# Patient Record
Sex: Female | Born: 1965 | Race: Black or African American | Hispanic: No | Smoking: Current some day smoker
Health system: Southern US, Community
[De-identification: ages and names within clinical notes are randomized; demographics above are authoritative.]

## PROBLEM LIST (undated history)

## (undated) DIAGNOSIS — M1990 Unspecified osteoarthritis, unspecified site: Secondary | ICD-10-CM

## (undated) DIAGNOSIS — J45909 Unspecified asthma, uncomplicated: Secondary | ICD-10-CM

## (undated) DIAGNOSIS — I10 Essential (primary) hypertension: Secondary | ICD-10-CM

## (undated) DIAGNOSIS — E119 Type 2 diabetes mellitus without complications: Secondary | ICD-10-CM

## (undated) DIAGNOSIS — M109 Gout, unspecified: Secondary | ICD-10-CM

## (undated) HISTORY — DX: Essential (primary) hypertension: I10

## (undated) HISTORY — DX: Type 2 diabetes mellitus without complications: E11.9

---

## 2007-05-24 ENCOUNTER — Emergency Department (HOSPITAL_COMMUNITY): Admission: EM | Admit: 2007-05-24 | Discharge: 2007-05-24 | Payer: Self-pay | Admitting: Emergency Medicine

## 2009-01-18 ENCOUNTER — Emergency Department (HOSPITAL_COMMUNITY): Admission: EM | Admit: 2009-01-18 | Discharge: 2009-01-18 | Payer: Self-pay | Admitting: Emergency Medicine

## 2009-07-02 ENCOUNTER — Emergency Department (HOSPITAL_COMMUNITY): Admission: EM | Admit: 2009-07-02 | Discharge: 2009-07-03 | Payer: Self-pay | Admitting: Emergency Medicine

## 2010-08-28 ENCOUNTER — Emergency Department (HOSPITAL_COMMUNITY): Payer: Self-pay

## 2010-08-28 ENCOUNTER — Emergency Department (HOSPITAL_COMMUNITY)
Admission: EM | Admit: 2010-08-28 | Discharge: 2010-08-28 | Disposition: A | Discharge status: Home / Self Care | Payer: Worker's Compensation | Attending: Emergency Medicine | Admitting: Emergency Medicine

## 2010-08-28 DIAGNOSIS — Y990 Civilian activity done for income or pay: Secondary | ICD-10-CM | POA: Insufficient documentation

## 2010-08-28 DIAGNOSIS — M25476 Effusion, unspecified foot: Secondary | ICD-10-CM | POA: Insufficient documentation

## 2010-08-28 DIAGNOSIS — S93409A Sprain of unspecified ligament of unspecified ankle, initial encounter: Secondary | ICD-10-CM | POA: Insufficient documentation

## 2010-08-28 DIAGNOSIS — M549 Dorsalgia, unspecified: Secondary | ICD-10-CM | POA: Insufficient documentation

## 2010-08-28 DIAGNOSIS — W208XXA Other cause of strike by thrown, projected or falling object, initial encounter: Secondary | ICD-10-CM | POA: Insufficient documentation

## 2010-08-28 DIAGNOSIS — M25579 Pain in unspecified ankle and joints of unspecified foot: Secondary | ICD-10-CM | POA: Insufficient documentation

## 2010-08-28 DIAGNOSIS — M25473 Effusion, unspecified ankle: Secondary | ICD-10-CM | POA: Insufficient documentation

## 2010-08-28 DIAGNOSIS — S8990XA Unspecified injury of unspecified lower leg, initial encounter: Secondary | ICD-10-CM | POA: Insufficient documentation

## 2010-08-28 DIAGNOSIS — J45909 Unspecified asthma, uncomplicated: Secondary | ICD-10-CM | POA: Insufficient documentation

## 2010-08-28 DIAGNOSIS — G8929 Other chronic pain: Secondary | ICD-10-CM | POA: Insufficient documentation

## 2010-09-21 ENCOUNTER — Ambulatory Visit (INDEPENDENT_AMBULATORY_CARE_PROVIDER_SITE_OTHER): Payer: BC Managed Care – PPO

## 2010-09-21 ENCOUNTER — Inpatient Hospital Stay (INDEPENDENT_AMBULATORY_CARE_PROVIDER_SITE_OTHER)
Admission: RE | Admit: 2010-09-21 | Discharge: 2010-09-21 | Disposition: A | Discharge status: Home / Self Care | Payer: BC Managed Care – PPO | Source: Ambulatory Visit | Attending: Family Medicine | Admitting: Family Medicine

## 2010-09-21 DIAGNOSIS — J069 Acute upper respiratory infection, unspecified: Secondary | ICD-10-CM

## 2010-09-21 DIAGNOSIS — B9789 Other viral agents as the cause of diseases classified elsewhere: Secondary | ICD-10-CM

## 2010-10-06 LAB — DIFFERENTIAL
Basophils Absolute: 0 10*3/uL (ref 0.0–0.1)
Basophils Relative: 0 % (ref 0–1)
Monocytes Absolute: 0.6 10*3/uL (ref 0.1–1.0)
Monocytes Relative: 5 % (ref 3–12)
Neutro Abs: 9.4 10*3/uL — ABNORMAL HIGH (ref 1.7–7.7)
Neutrophils Relative %: 71 % (ref 43–77)

## 2010-10-06 LAB — CBC
HCT: 42.8 % (ref 36.0–46.0)
Hemoglobin: 14 g/dL (ref 12.0–15.0)
MCHC: 32.6 g/dL (ref 30.0–36.0)
MCV: 85.6 fL (ref 78.0–100.0)
Platelets: 308 10*3/uL (ref 150–400)
RDW: 13.3 % (ref 11.5–15.5)
WBC: 13.2 10*3/uL — ABNORMAL HIGH (ref 4.0–10.5)

## 2010-10-06 LAB — URINALYSIS, ROUTINE W REFLEX MICROSCOPIC
Bilirubin Urine: NEGATIVE
Specific Gravity, Urine: 1.03 (ref 1.005–1.030)

## 2010-10-06 LAB — URINE MICROSCOPIC-ADD ON

## 2010-10-06 LAB — POCT I-STAT, CHEM 8
BUN: 16 mg/dL (ref 6–23)
Calcium, Ion: 1.1 mmol/L — ABNORMAL LOW (ref 1.12–1.32)
Chloride: 110 mEq/L (ref 96–112)
Creatinine, Ser: 0.7 mg/dL (ref 0.4–1.2)
Glucose, Bld: 94 mg/dL (ref 70–99)

## 2010-10-06 LAB — COMPREHENSIVE METABOLIC PANEL
ALT: 15 U/L (ref 0–35)
AST: 26 U/L (ref 0–37)
Albumin: 4.2 g/dL (ref 3.5–5.2)
BUN: 12 mg/dL (ref 6–23)
CO2: 22 mEq/L (ref 19–32)
Creatinine, Ser: 0.71 mg/dL (ref 0.4–1.2)
GFR calc non Af Amer: >60 mL/min (ref >60)
Total Bilirubin: 1.1 mg/dL (ref 0.3–1.2)
Total Protein: 7.3 g/dL (ref 6.0–8.3)

## 2010-10-06 LAB — HEMOCCULT GUIAC POC 1CARD (OFFICE): Fecal Occult Bld: POSITIVE

## 2010-10-06 LAB — LIPASE, BLOOD: Lipase: 14 U/L (ref 11–59)

## 2011-03-11 ENCOUNTER — Other Ambulatory Visit: Payer: Self-pay | Admitting: Family Medicine

## 2011-03-11 ENCOUNTER — Ambulatory Visit
Admission: RE | Admit: 2011-03-11 | Discharge: 2011-03-11 | Disposition: A | Discharge status: Home / Self Care | Payer: BC Managed Care – PPO | Source: Ambulatory Visit | Attending: Family Medicine | Admitting: Family Medicine

## 2011-03-11 DIAGNOSIS — M12569 Traumatic arthropathy, unspecified knee: Secondary | ICD-10-CM

## 2011-03-28 ENCOUNTER — Emergency Department (HOSPITAL_COMMUNITY)
Admission: EM | Admit: 2011-03-28 | Discharge: 2011-03-28 | Disposition: A | Discharge status: Home / Self Care | Payer: BC Managed Care – PPO | Attending: Emergency Medicine | Admitting: Emergency Medicine

## 2011-03-28 ENCOUNTER — Emergency Department (HOSPITAL_COMMUNITY): Payer: BC Managed Care – PPO

## 2011-03-28 DIAGNOSIS — M545 Low back pain, unspecified: Secondary | ICD-10-CM | POA: Insufficient documentation

## 2011-03-28 DIAGNOSIS — R350 Frequency of micturition: Secondary | ICD-10-CM | POA: Insufficient documentation

## 2011-03-28 DIAGNOSIS — Z79899 Other long term (current) drug therapy: Secondary | ICD-10-CM | POA: Insufficient documentation

## 2011-03-28 DIAGNOSIS — Z87891 Personal history of nicotine dependence: Secondary | ICD-10-CM | POA: Insufficient documentation

## 2011-03-28 DIAGNOSIS — N200 Calculus of kidney: Secondary | ICD-10-CM | POA: Insufficient documentation

## 2011-03-28 DIAGNOSIS — G8929 Other chronic pain: Secondary | ICD-10-CM | POA: Insufficient documentation

## 2011-03-28 LAB — URINALYSIS, ROUTINE W REFLEX MICROSCOPIC
Bilirubin Urine: NEGATIVE
Ketones, ur: NEGATIVE mg/dL
Urobilinogen, UA: 0.2 mg/dL (ref 0.0–1.0)
pH: 8.5 — ABNORMAL HIGH (ref 5.0–8.0)

## 2011-03-28 LAB — POCT I-STAT, CHEM 8
Creatinine, Ser: 0.7 mg/dL (ref 0.50–1.10)
Hemoglobin: 12.9 g/dL (ref 12.0–15.0)
Sodium: 141 mEq/L (ref 135–145)
TCO2: 23 mmol/L (ref 0–100)

## 2011-03-28 LAB — WET PREP, GENITAL
WBC, Wet Prep HPF POC: NONE SEEN
Yeast Wet Prep HPF POC: NONE SEEN

## 2011-03-28 LAB — URINE MICROSCOPIC-ADD ON

## 2011-03-28 LAB — POCT PREGNANCY, URINE: Preg Test, Ur: NEGATIVE

## 2011-03-28 MED ORDER — IOHEXOL 300 MG/ML  SOLN
130.0000 mL | Freq: Once | INTRAMUSCULAR | Status: AC | PRN
  Administered 2011-03-28: 130 mL via INTRAVENOUS

## 2011-03-30 LAB — GC/CHLAMYDIA PROBE AMP, GENITAL
Chlamydia, DNA Probe: NEGATIVE
GC Probe Amp, Genital: NEGATIVE

## 2012-03-31 ENCOUNTER — Encounter (HOSPITAL_COMMUNITY): Payer: Self-pay | Admitting: Emergency Medicine

## 2012-03-31 ENCOUNTER — Emergency Department (HOSPITAL_COMMUNITY)
Admission: EM | Admit: 2012-03-31 | Discharge: 2012-03-31 | Disposition: A | Discharge status: Home / Self Care | Payer: BC Managed Care – PPO | Attending: Emergency Medicine | Admitting: Emergency Medicine

## 2012-03-31 ENCOUNTER — Emergency Department (HOSPITAL_COMMUNITY): Payer: BC Managed Care – PPO

## 2012-03-31 DIAGNOSIS — Z886 Allergy status to analgesic agent status: Secondary | ICD-10-CM | POA: Insufficient documentation

## 2012-03-31 DIAGNOSIS — M79672 Pain in left foot: Secondary | ICD-10-CM

## 2012-03-31 DIAGNOSIS — I878 Other specified disorders of veins: Secondary | ICD-10-CM

## 2012-03-31 DIAGNOSIS — Z91018 Allergy to other foods: Secondary | ICD-10-CM | POA: Insufficient documentation

## 2012-03-31 DIAGNOSIS — M25572 Pain in left ankle and joints of left foot: Secondary | ICD-10-CM

## 2012-03-31 DIAGNOSIS — I872 Venous insufficiency (chronic) (peripheral): Secondary | ICD-10-CM | POA: Insufficient documentation

## 2012-03-31 DIAGNOSIS — Z87828 Personal history of other (healed) physical injury and trauma: Secondary | ICD-10-CM | POA: Insufficient documentation

## 2012-03-31 DIAGNOSIS — M25579 Pain in unspecified ankle and joints of unspecified foot: Secondary | ICD-10-CM | POA: Insufficient documentation

## 2012-03-31 HISTORY — DX: Unspecified asthma, uncomplicated: J45.909

## 2012-03-31 MED ORDER — OXYCODONE-ACETAMINOPHEN 5-325 MG PO TABS
2.0000 | ORAL_TABLET | Freq: Once | ORAL | Status: DC
  Filled 2012-03-31: qty 2

## 2012-03-31 MED ORDER — OXYCODONE-ACETAMINOPHEN 5-325 MG PO TABS: 1.0000 | ORAL_TABLET | Freq: Four times a day (QID) | ORAL | Status: DC | PRN

## 2012-03-31 MED ORDER — TRAMADOL HCL 50 MG PO TABS
50.0000 mg | ORAL_TABLET | Freq: Once | ORAL | Status: AC
  Administered 2012-03-31: 50 mg via ORAL
  Filled 2012-03-31: qty 1

## 2012-03-31 NOTE — ED Notes (Addendum)
Pt reports pain on her left ankle/foot, started last night and worsen this morning. Pt reports hurting her ankle at work and does not remember when.

## 2012-03-31 NOTE — ED Provider Notes (Signed)
Medical screening examination/treatment/procedure(s) were performed by non-physician practitioner and as supervising physician I was immediately available for consultation/collaboration.   Lyanne Co, MD 03/31/12 [phone]

## 2012-03-31 NOTE — ED Provider Notes (Signed)
History     CSN: [id number]  Arrival date & time 03/31/12  1253   First MD Initiated Contact with Patient 03/31/12 1324      Chief Complaint  Patient presents with  . Ankle Pain  . Foot Pain    (Consider location/radiation/quality/duration/timing/severity/associated sxs/prior treatment) HPI Comments: Robin Meza 46 y.o. female   The chief complaint is: Patient presents with:   Ankle Pain   Foot Pain   The patient has medical history significant for:   Past Medical History:   Asthma                                                      Patient presents s/p fall 08/2010 at work. Patient was seen in ED during that time and imaging was unremarkable. Patient states the the ankle swells with pain and then "opens up" Patient states that last week there was open area on the medial portion of her left ankle, she went to her primary care who wrote for bactroban ointment. Denies fever or chills. Denies NVD or abdominal pain. Reports gait issues and need for cane.      The history is provided by the patient. No language interpreter was used.    Past Medical History  Diagnosis Date  . Asthma     Past Surgical History  Procedure Date  . Abdominal hysterectomy     No family history on file.  History  Substance Use Topics  . Smoking status: Not on file  . Smokeless tobacco: Current User  . Alcohol Use: No    OB History    Grav Para Term Preterm Abortions TAB SAB Ect Mult Living                  Review of Systems  Constitutional: Negative for fever and chills.  Cardiovascular: Positive for leg swelling.  Gastrointestinal: Negative for nausea, vomiting, abdominal pain and diarrhea.  Musculoskeletal: Positive for arthralgias and gait problem.  All other systems reviewed and are negative.    Allergies  Aspirin and Tomato  Home Medications   Current Outpatient Rx  Name Route Sig Dispense Refill  . CYCLOBENZAPRINE HCL 5 MG PO TABS Oral Take 5 mg by mouth 2  (two) times daily.    Marland Kitchen HYDROCHLOROTHIAZIDE 25 MG PO TABS Oral Take 25 mg by mouth daily.    . ADULT MULTIVITAMIN W/MINERALS CH Oral Take 1 tablet by mouth daily.    Marland Kitchen MUPIROCIN 2 % EX OINT Topical Apply 1 application topically 3 (three) times daily.      BP 127/77  Pulse 92  Temp 98.9 F (37.2 C) (Oral)  Resp 16  SpO2 98%  LMP 09/21/2010  Physical Exam  Nursing note and vitals reviewed. Constitutional: She appears well-developed and well-nourished.  HENT:  Head: Normocephalic.  Eyes: Conjunctivae normal and EOM are normal. No scleral icterus.  Neck: Normal range of motion. Neck supple.  Cardiovascular: Normal rate, regular rhythm and normal heart sounds.   Pulmonary/Chest: Effort normal and breath sounds normal. She has no rales.  Abdominal: Soft. Bowel sounds are normal.  Musculoskeletal: Normal range of motion. She exhibits tenderness. She exhibits no edema.       No pitting edema appreciated. Good pedal pulse, good cap refill. Tenderness to palpation of medial malleolus. Well healed scar on medial portion of  left lower leg where Bactroban was used to treat ulceration.   Neurological: She is alert.  Skin: Skin is warm and dry.    ED Course  Procedures (including critical care time)  Labs Reviewed - No data to display Dg Ankle Complete Left  03/31/2012  *RADIOLOGY REPORT*  Clinical Data: Diffuse left ankle pain.  LEFT ANKLE COMPLETE - 3+ VIEW  Comparison: 08/28/2010  Findings: No acute bony abnormality.  Specifically, no fracture, subluxation, or dislocation.  Soft tissues are intact. Joint spaces are maintained.  Normal bone mineralization.  IMPRESSION: No acute bony abnormality.   Original Report Authenticated By: Cyndie Chime, M.D.      1. Left ankle pain   2. Left foot pain   3. Venous stasis       MDM  Patient presented with complain of left lower leg and foot pain. Patient attributes this to a work related fall that occurred last year. Imaging at that time was  unremarkable for fracture. Imaging done this visit also unremarkable. Pain medication given with improvement. Patient informed that the edema and ulcerations are most likely secondary to venous stasis. Patient counseled on salt reduction, use of compression hose, and follow-up with primary care. No red flags for DVT, cellulitis, or CHF.        Pixie Casino, PA-C 03/31/12 1535

## 2012-05-25 ENCOUNTER — Emergency Department (HOSPITAL_COMMUNITY)
Admission: EM | Admit: 2012-05-25 | Discharge: 2012-05-25 | Disposition: A | Discharge status: Home / Self Care | Payer: BC Managed Care – PPO | Attending: Emergency Medicine | Admitting: Emergency Medicine

## 2012-05-25 ENCOUNTER — Encounter (HOSPITAL_COMMUNITY): Payer: Self-pay | Admitting: *Deleted

## 2012-05-25 DIAGNOSIS — Y929 Unspecified place or not applicable: Secondary | ICD-10-CM | POA: Insufficient documentation

## 2012-05-25 DIAGNOSIS — W540XXA Bitten by dog, initial encounter: Secondary | ICD-10-CM | POA: Insufficient documentation

## 2012-05-25 DIAGNOSIS — S5010XA Contusion of unspecified forearm, initial encounter: Secondary | ICD-10-CM | POA: Insufficient documentation

## 2012-05-25 DIAGNOSIS — Y939 Activity, unspecified: Secondary | ICD-10-CM | POA: Insufficient documentation

## 2012-05-25 DIAGNOSIS — J45909 Unspecified asthma, uncomplicated: Secondary | ICD-10-CM | POA: Insufficient documentation

## 2012-05-25 DIAGNOSIS — Z79899 Other long term (current) drug therapy: Secondary | ICD-10-CM | POA: Insufficient documentation

## 2012-05-25 NOTE — ED Provider Notes (Signed)
Medical screening examination/treatment/procedure(s) were performed by non-physician practitioner and as supervising physician I was immediately available for consultation/collaboration.  Ethelda Chick, MD 05/25/12 [phone]

## 2012-05-25 NOTE — ED Provider Notes (Signed)
History     CSN: [id number]  Arrival date & time 05/25/12  2116   First MD Initiated Contact with Patient 05/25/12 2211      Chief Complaint  Patient presents with  . Animal Bite    (Consider location/radiation/quality/duration/timing/severity/associated sxs/prior treatment) HPI Comments: 46 year old female presents emergency department after being bit by a dog last night on her right forearm. She states she did not get bit hard and it was over her heavy clothing, and did not notice any open wounds or injury last night. Today she looks down her arm and noticed she had a small bruise. 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 She does not know the immunization status of the dog. Currently states her arm pain is only present if you touch on the area where the bruises. She has not tried any alleviating factors.  The history is provided by the patient.    Past Medical History  Diagnosis Date  . Asthma     Past Surgical History  Procedure Date  . Cesarean section     History reviewed. No pertinent family history.  History  Substance Use Topics  . Smoking status: Not on file  . Smokeless tobacco: Current User  . Alcohol Use: No    OB History    Grav Para Term Preterm Abortions TAB SAB Ect Mult Living                  Review of Systems  Constitutional: Negative for fever and chills.  Musculoskeletal: Negative for joint swelling.  Skin: Positive for color change. Negative for wound.  Neurological: Negative for numbness.    Allergies  Aspirin and Tomato  Home Medications   Current Outpatient Rx  Name  Route  Sig  Dispense  Refill  . CYCLOBENZAPRINE HCL 5 MG PO TABS   Oral   Take 5 mg by mouth 2 (two) times daily.         Marland Kitchen HYDROCHLOROTHIAZIDE 25 MG PO TABS   Oral   Take 25 mg by mouth daily.         . ADULT MULTIVITAMIN W/MINERALS CH   Oral   Take 1 tablet by mouth daily.         . OXYCODONE-ACETAMINOPHEN 5-325 MG PO  TABS   Oral   Take 1 tablet by mouth every 6 (six) hours as needed. Jaw pain           BP 155/86  Pulse 82  Temp 98.1 F (36.7 C) (Oral)  Resp 18  SpO2 96%  Physical Exam  Nursing note and vitals reviewed. Constitutional: She is oriented to person, place, and time. She appears well-developed. No distress.       Overweight  HENT:  Head: Normocephalic and atraumatic.  Eyes: Conjunctivae normal are normal.  Neck: Normal range of motion.  Cardiovascular: Normal rate, regular rhythm, normal heart sounds and intact distal pulses.   Pulmonary/Chest: Effort normal and breath sounds normal.  Musculoskeletal: Normal range of motion. She exhibits no edema.  Neurological: She is alert and oriented to person, place, and time.  Skin: Skin is warm, dry and intact.     Psychiatric: She has a normal mood and affect. Her behavior is normal.    ED Course  Procedures (including critical care time)  Labs Reviewed - No data to display No results found.   1. Dog bite       MDM  70-year-old with small bruise  to her right forearm after being bit by a dog. There is no open wound. Patient was wearing heavy clothing at the time of the bite. Patient only came in today because she noticed a bruise. No need for antibiotics or immunizations.        Trevor Mace, PA-C 05/25/12 2303

## 2012-05-25 NOTE — ED Notes (Signed)
Pt was bit by dog last night to right forearm. States "the dog is around I feed it, its never attacked me before." Pt is unsure of dogs owner or of dog vaccination history. Also reports unknown tetanus shot.

## 2012-09-17 IMAGING — CR DG ANKLE COMPLETE 3+V*L*
3 series · 3 of 3 positions shown · non-contrast
Comparison: None.

CLINICAL DATA: 45-year-old female status post blunt trauma with
pain and swelling.

LEFT ANKLE COMPLETE - 3+ VIEW

[t ankle joint ap left]
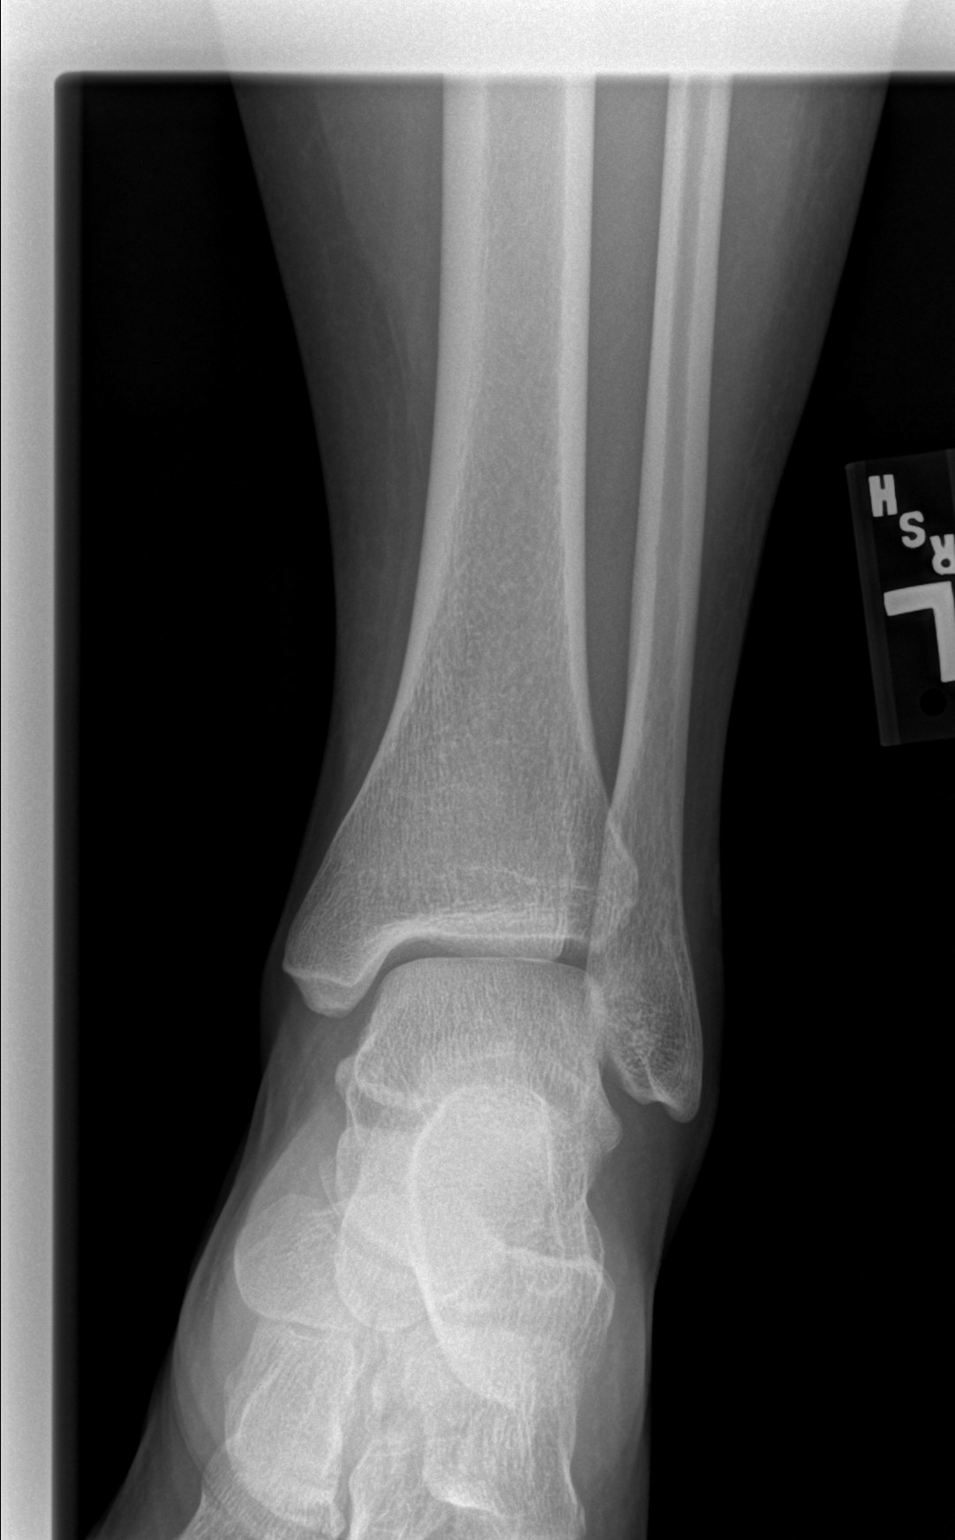

[t ankle joint oblique left]
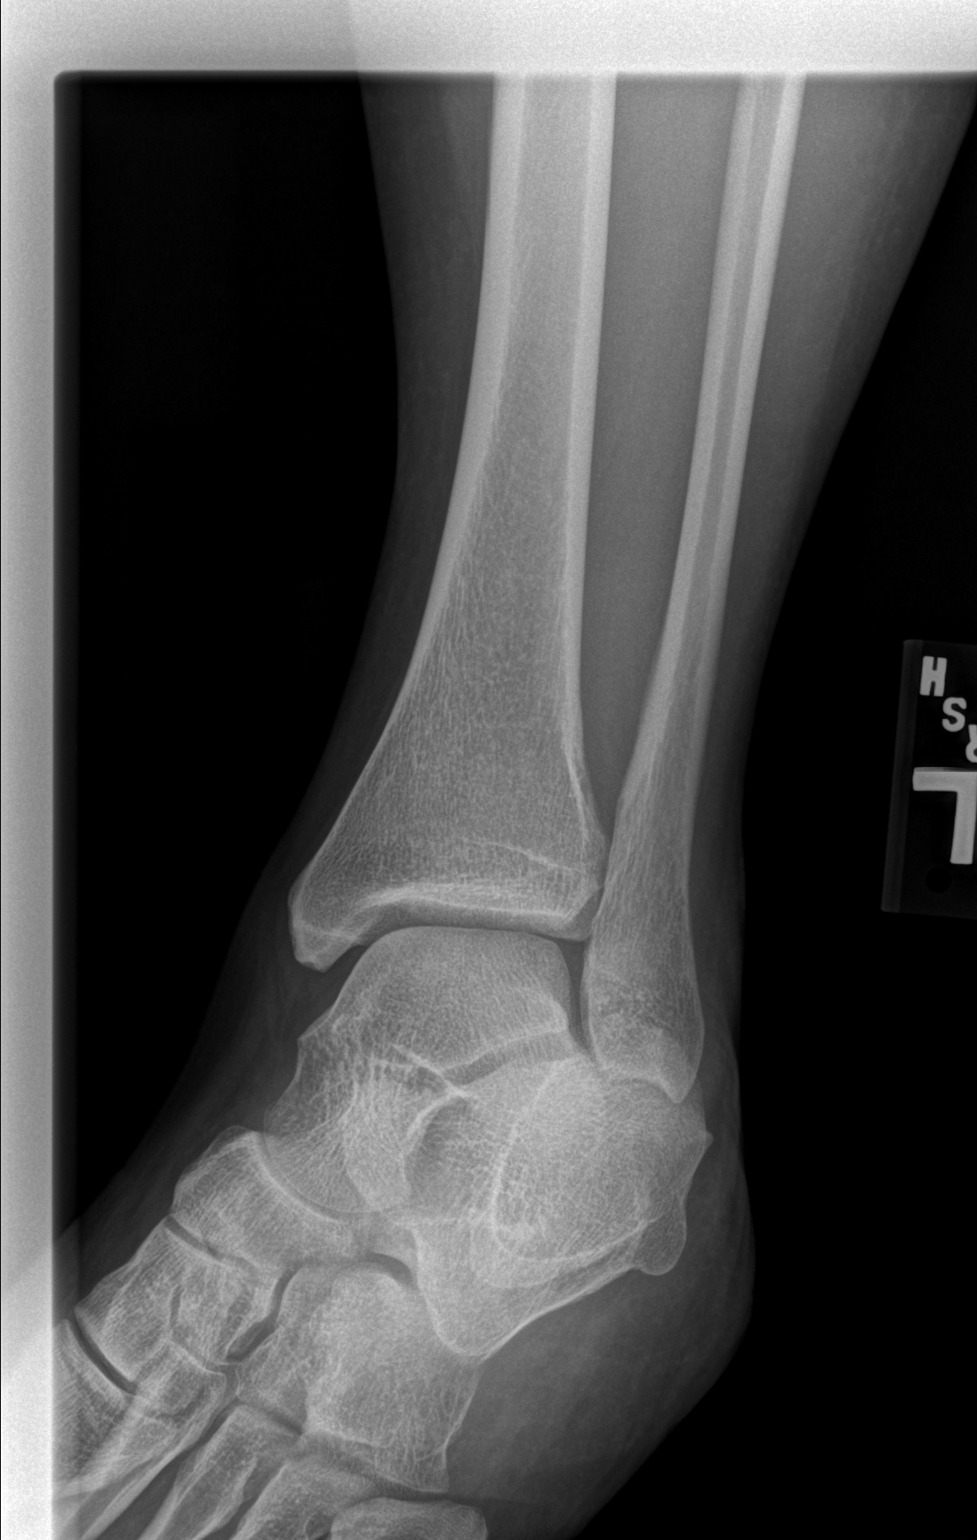

[t ankle joint lat left]
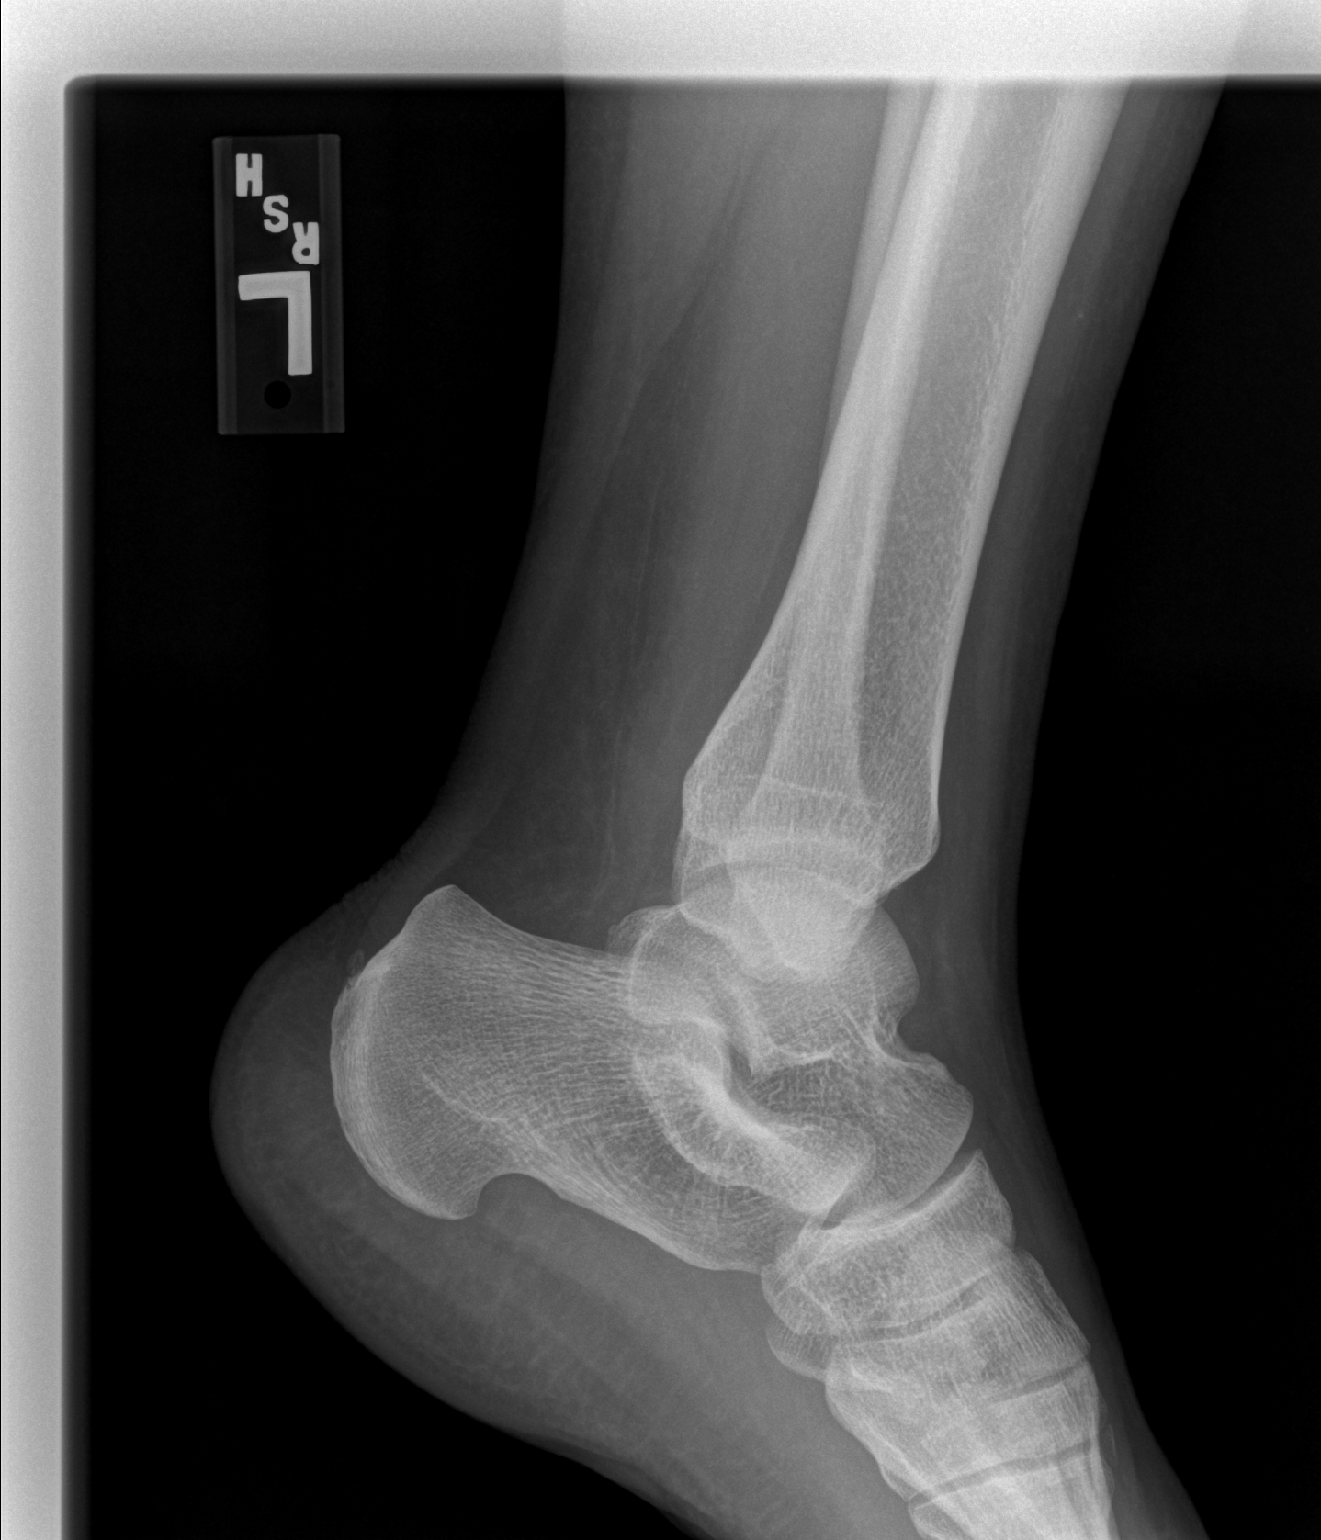

[3 of 3 positions shown; findings below may reference images not displayed]

FINDINGS: Bone mineralization is within normal limits.  Normal
mortise joint alignment.  No joint effusion.  Calcaneus appears
intact.  Talar dome intact.  No acute fracture.
IMPRESSION: No acute fracture or dislocation identified about the left ankle.

## 2012-10-28 ENCOUNTER — Other Ambulatory Visit: Payer: Self-pay | Admitting: Family Medicine

## 2012-10-28 ENCOUNTER — Ambulatory Visit
Admission: RE | Admit: 2012-10-28 | Discharge: 2012-10-28 | Disposition: A | Discharge status: Home / Self Care | Payer: BC Managed Care – PPO | Source: Ambulatory Visit | Attending: Family Medicine | Admitting: Family Medicine

## 2012-10-28 DIAGNOSIS — G8929 Other chronic pain: Secondary | ICD-10-CM

## 2014-04-21 IMAGING — CR DG ANKLE COMPLETE 3+V*L*
3 series · 3 of 3 positions shown · non-contrast
Comparison: 08/28/2010

CLINICAL DATA: Diffuse left ankle pain.

LEFT ANKLE COMPLETE - 3+ VIEW

[x ankle ap left]
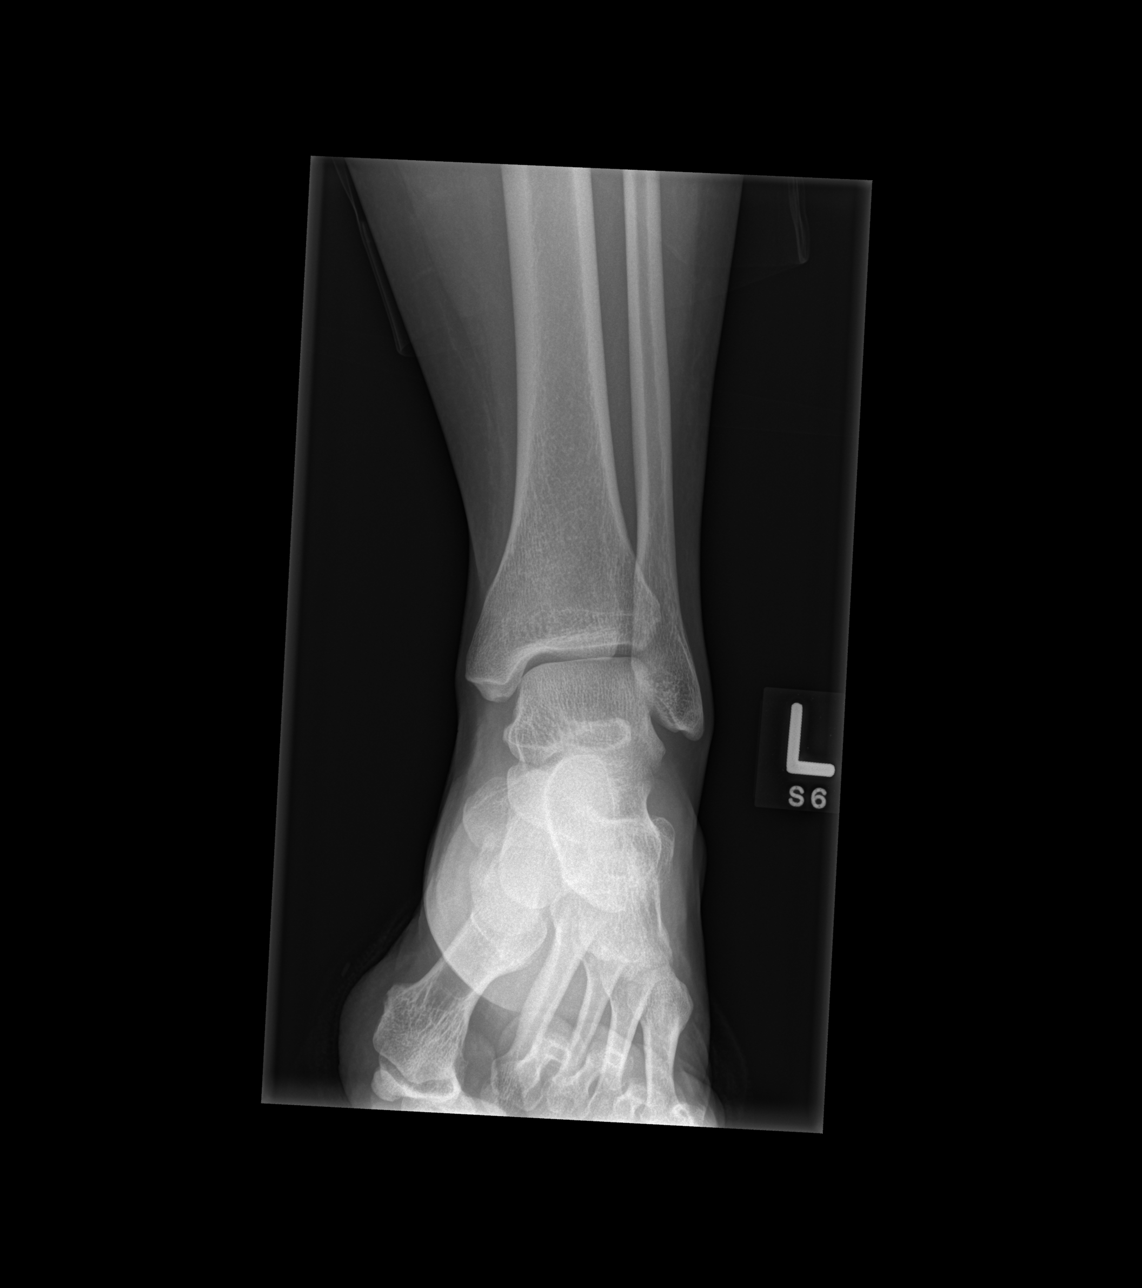

[x ankle obl left]
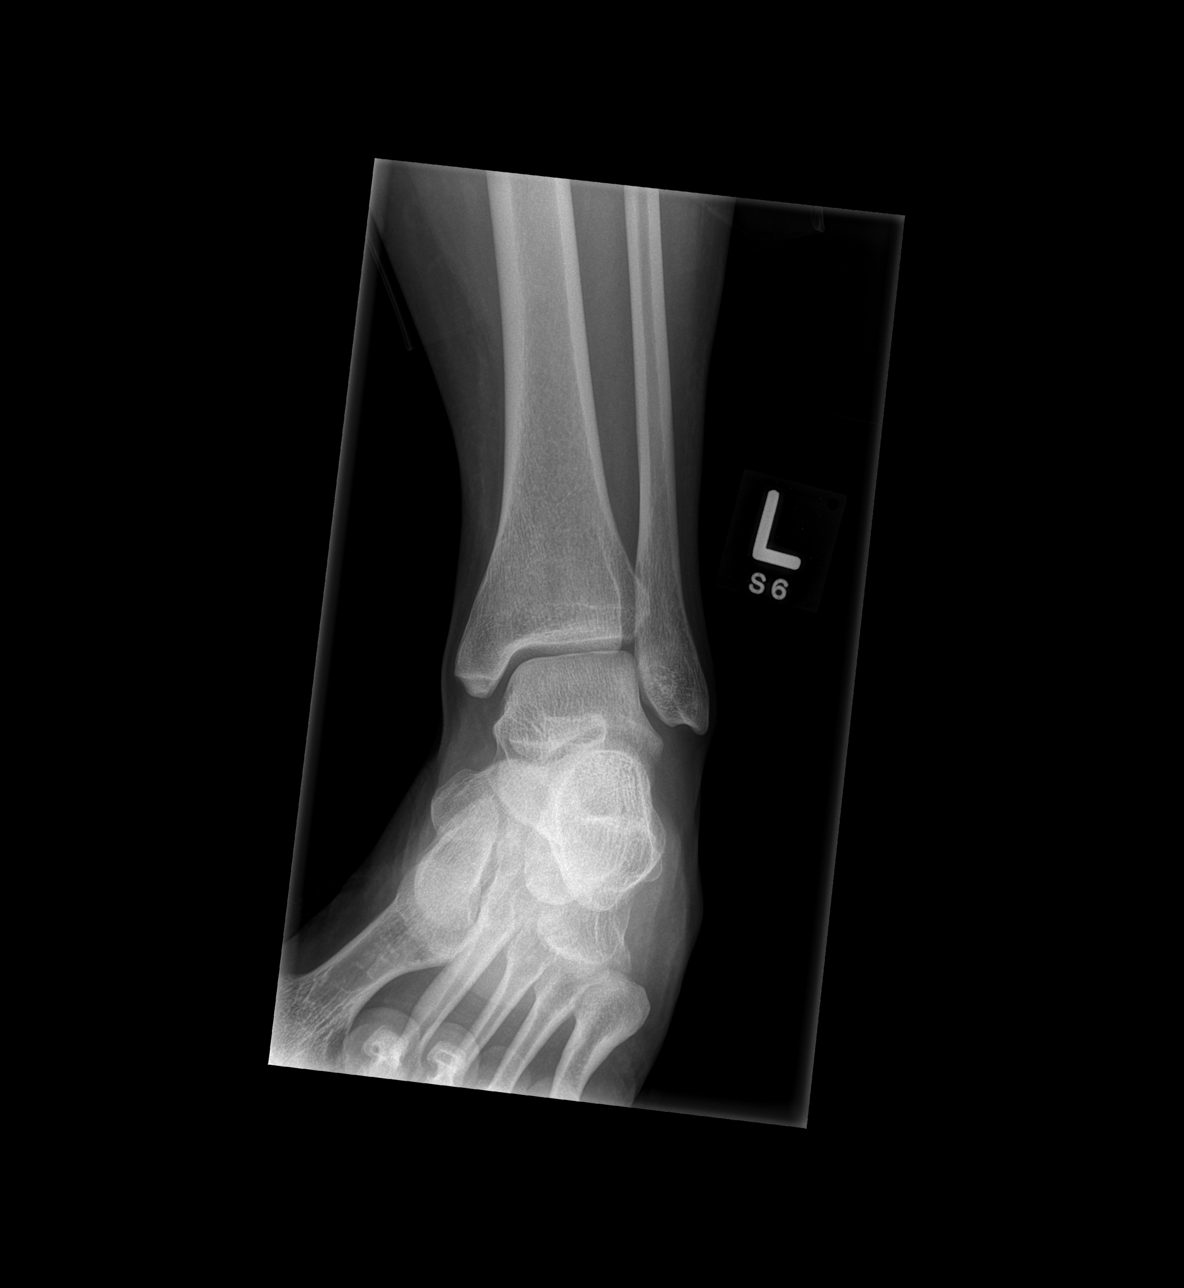

[x ankle lat left]
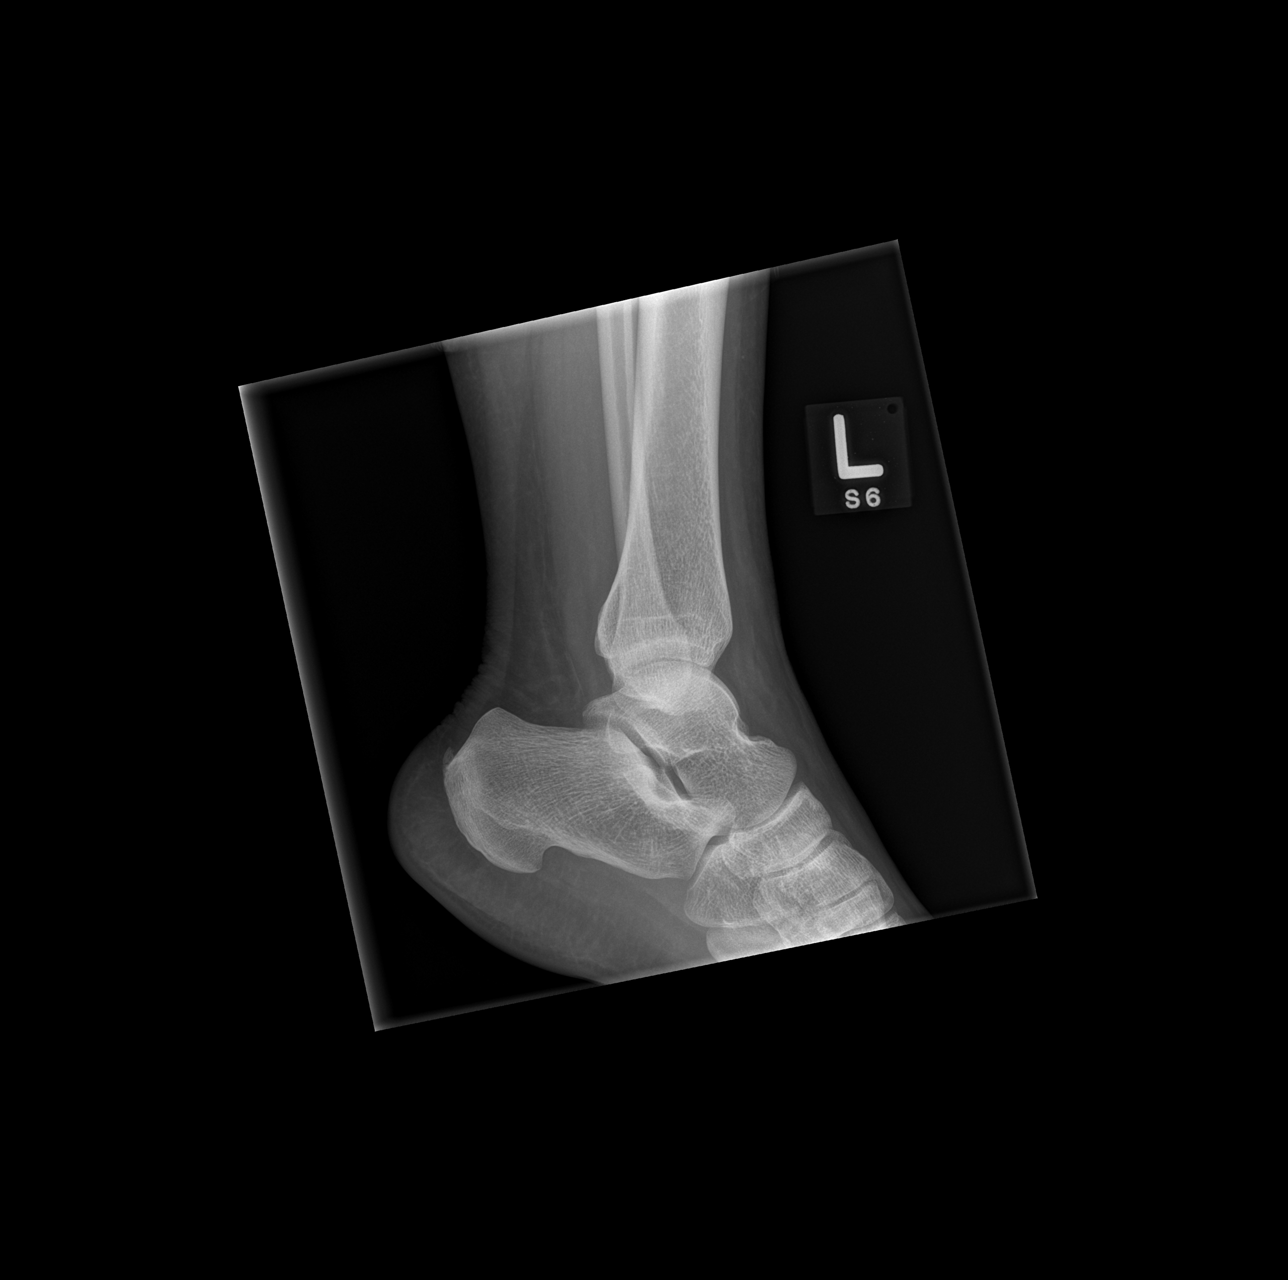

[3 of 3 positions shown; findings below may reference images not displayed]

FINDINGS: No acute bony abnormality.  Specifically, no fracture,
subluxation, or dislocation.  Soft tissues are intact. Joint spaces
are maintained.  Normal bone mineralization.
IMPRESSION: No acute bony abnormality.

## 2017-07-19 ENCOUNTER — Other Ambulatory Visit: Payer: Self-pay

## 2017-07-19 ENCOUNTER — Encounter (HOSPITAL_COMMUNITY): Payer: Self-pay

## 2017-07-19 ENCOUNTER — Emergency Department (HOSPITAL_COMMUNITY)
Admission: EM | Admit: 2017-07-19 | Discharge: 2017-07-19 | Disposition: A | Discharge status: Home / Self Care | Payer: Medicaid Other | Attending: Emergency Medicine | Admitting: Emergency Medicine

## 2017-07-19 DIAGNOSIS — Z0000 Encounter for general adult medical examination without abnormal findings: Secondary | ICD-10-CM

## 2017-07-19 DIAGNOSIS — Z79899 Other long term (current) drug therapy: Secondary | ICD-10-CM | POA: Diagnosis not present

## 2017-07-19 DIAGNOSIS — F17200 Nicotine dependence, unspecified, uncomplicated: Secondary | ICD-10-CM | POA: Insufficient documentation

## 2017-07-19 DIAGNOSIS — J45909 Unspecified asthma, uncomplicated: Secondary | ICD-10-CM | POA: Diagnosis not present

## 2017-07-19 DIAGNOSIS — Z046 Encounter for general psychiatric examination, requested by authority: Secondary | ICD-10-CM | POA: Diagnosis present

## 2017-07-19 HISTORY — DX: Gout, unspecified: M10.9

## 2017-07-19 HISTORY — DX: Unspecified osteoarthritis, unspecified site: M19.90

## 2017-07-19 NOTE — ED Notes (Signed)
Pt attempting to locate transportation home. Pts daughter who attempted to IVC her is waiting in [place]. Daughter has been informed by ED Staff and [facility] [employer] to that we cannot reveal any information due to HIPPAA. Daughter has been asked to leave the premises ED registration and Security.

## 2017-07-19 NOTE — ED Provider Notes (Signed)
[facility]-EMERGENCY DEPT Provider Note   CSN: [id number] Arrival date & time: 07/19/17  2053     History   Chief Complaint Chief Complaint  Patient presents with  . Psychiatric Evaluation    HPI Robin Meza is a 52 y.o. female.  HPI Patient brought in by police.  Reportedly had an argument with her daughter.  Daughter was attempting to take out IVC paperwork but magistrate would not accept it.  Denies suicidal homicidal thoughts.  Patient states she is not sure why her daughter would fill out the paperwork.  Patient is basically without complaints. Past Medical History:  Diagnosis Date  . Arthritis   . Asthma   . Gout     There are no active problems to display for this patient.   Past Surgical History:  Procedure Laterality Date  . CESAREAN SECTION      OB History    No data available       Home Medications    Prior to Admission medications   Medication Sig Start Date End Date Taking? Authorizing Provider  Biotin 1000 MCG tablet Take 1,000 mcg by mouth daily.   Yes [provider]  Docusate Calcium (STOOL SOFTENER PO) Take 1 tablet by mouth daily.   Yes [provider]  hydrochlorothiazide (HYDRODIURIL) 25 MG tablet Take 25 mg by mouth daily.   Yes [provider]  hydrOXYzine (ATARAX/VISTARIL) 10 MG tablet Take 10 mg by mouth 3 (three) times daily as needed for anxiety.   Yes [provider]    Family History History reviewed. No pertinent family history.  Social History Social History   Tobacco Use  . Smoking status: Current Some Day Smoker    Packs/day: 1.00  . Smokeless tobacco: Current User  Substance Use Topics  . Alcohol use: No  . Drug use: Not on file     Allergies   Aspirin and Tomato   Review of Systems Review of Systems  Constitutional: Negative for appetite change.  Respiratory: Negative for shortness of breath.   Gastrointestinal: Negative for abdominal pain.    Endocrine: Negative for polyphagia.  Genitourinary: Negative for flank pain.  Musculoskeletal: Negative for back pain.  Neurological: Negative for speech difficulty.  Hematological: Negative for adenopathy.  Psychiatric/Behavioral: Negative for dysphoric mood and hallucinations.     Physical Exam Updated Vital Signs BP (!) 153/89 (BP Location: Left Arm)   Pulse 89   Temp 98.9 F (37.2 C) (Oral)   Resp 20   LMP 09/21/2010   SpO2 98%   Physical Exam  Constitutional: She appears well-developed.  HENT:  Head: Atraumatic.  Neck: Neck supple.  Cardiovascular: Normal rate.  Pulmonary/Chest: Effort normal.  Abdominal: Soft.  Neurological: She is alert.  Skin: Skin is warm. Capillary refill takes less than 2 seconds.  Psychiatric: She has a normal mood and affect.     ED Treatments / Results  Labs (all labs ordered are listed, but only abnormal results are displayed) Labs Reviewed - No data to display  EKG  EKG Interpretation None       Radiology No results found.  Procedures Procedures (including critical care time)  Medications Ordered in ED Medications - No data to display   Initial Impression / Assessment and Plan / ED Course  I have reviewed the triage vital signs and the nursing notes.  Pertinent labs & imaging results that were available during my care of the patient were reviewed by me and considered in my medical  decision making (see chart for details).     Patient brought in.  Reportedly was having argument with daughter and daughter threatened to take out IVC paperwork.  Patient is without complaints.  Denies suicidal or homicidal thoughts.  The IVC paperwork did not go through with the magistrate and patient was released.  Final Clinical Impressions(s) / ED Diagnoses   Final diagnoses:  Well adult exam    ED Discharge Orders    None       Benjiman CorePickering, Adriann Thau, MD 07/19/17 2250

## 2017-07-19 NOTE — ED Notes (Signed)
Bed: WA28 Expected date:  Expected time:  Means of arrival:  Comments: 

## 2017-07-19 NOTE — ED Triage Notes (Signed)
BIB [employer] from [facility] Home, [employer] reports that pt was involved w/ an argument w/ her daughter who is in the process of IVC'ing pt. Pt denies SI/HI/AH/VH. Pt denies hx of IVC. Pt reports her daughter has hx of mental health illness and has been IVC'd by her. Pt is AOX4, calm, cooperative, NAD. No sign of physical harm or injury observed. Pt ambulatory independently.

## 2017-09-23 DIAGNOSIS — R7303 Prediabetes: Secondary | ICD-10-CM | POA: Insufficient documentation

## 2017-09-23 DIAGNOSIS — E559 Vitamin D deficiency, unspecified: Secondary | ICD-10-CM | POA: Insufficient documentation

## 2018-06-08 DIAGNOSIS — Z6835 Body mass index (BMI) 35.0-35.9, adult: Secondary | ICD-10-CM | POA: Insufficient documentation

## 2018-06-08 DIAGNOSIS — E661 Drug-induced obesity: Secondary | ICD-10-CM | POA: Insufficient documentation

## 2018-11-22 DIAGNOSIS — M4317 Spondylolisthesis, lumbosacral region: Secondary | ICD-10-CM | POA: Insufficient documentation

## 2018-11-22 DIAGNOSIS — J449 Chronic obstructive pulmonary disease, unspecified: Secondary | ICD-10-CM | POA: Insufficient documentation

## 2019-05-19 DIAGNOSIS — M1712 Unilateral primary osteoarthritis, left knee: Secondary | ICD-10-CM | POA: Insufficient documentation

## 2020-02-09 DIAGNOSIS — I5032 Chronic diastolic (congestive) heart failure: Secondary | ICD-10-CM | POA: Insufficient documentation

## 2021-01-08 DIAGNOSIS — Z720 Tobacco use: Secondary | ICD-10-CM | POA: Insufficient documentation

## 2021-07-15 DIAGNOSIS — I2510 Atherosclerotic heart disease of native coronary artery without angina pectoris: Secondary | ICD-10-CM | POA: Insufficient documentation

## 2022-03-26 DIAGNOSIS — N1831 Chronic kidney disease, stage 3a: Secondary | ICD-10-CM | POA: Insufficient documentation

## 2023-04-15 DIAGNOSIS — M47816 Spondylosis without myelopathy or radiculopathy, lumbar region: Secondary | ICD-10-CM | POA: Insufficient documentation

## 2023-07-14 ENCOUNTER — Ambulatory Visit (INDEPENDENT_AMBULATORY_CARE_PROVIDER_SITE_OTHER): Payer: Medicaid Other | Admitting: Family Medicine

## 2023-07-14 ENCOUNTER — Encounter: Payer: Self-pay | Admitting: Family Medicine

## 2023-07-14 VITALS — BP 97/64 | HR 90 | Ht 63.0 in | Wt 200.2 lb

## 2023-07-14 DIAGNOSIS — I5030 Unspecified diastolic (congestive) heart failure: Secondary | ICD-10-CM

## 2023-07-14 DIAGNOSIS — I10 Essential (primary) hypertension: Secondary | ICD-10-CM | POA: Diagnosis not present

## 2023-07-14 DIAGNOSIS — N1831 Chronic kidney disease, stage 3a: Secondary | ICD-10-CM | POA: Diagnosis not present

## 2023-07-14 DIAGNOSIS — I5032 Chronic diastolic (congestive) heart failure: Secondary | ICD-10-CM

## 2023-07-14 DIAGNOSIS — J449 Chronic obstructive pulmonary disease, unspecified: Secondary | ICD-10-CM

## 2023-07-14 DIAGNOSIS — I2510 Atherosclerotic heart disease of native coronary artery without angina pectoris: Secondary | ICD-10-CM

## 2023-07-14 DIAGNOSIS — M47816 Spondylosis without myelopathy or radiculopathy, lumbar region: Secondary | ICD-10-CM

## 2023-07-14 DIAGNOSIS — Z720 Tobacco use: Secondary | ICD-10-CM

## 2023-07-14 DIAGNOSIS — R7303 Prediabetes: Secondary | ICD-10-CM

## 2023-07-14 DIAGNOSIS — Z1231 Encounter for screening mammogram for malignant neoplasm of breast: Secondary | ICD-10-CM

## 2023-07-14 NOTE — Assessment & Plan Note (Signed)
 Discussed what CKD 3a is and what this means. She is on lisinopril that will help with renal protection.  - recently had blood work two weeks ago, will need repeat in two months to evaluate with BMP and microalbumin/cr ratio

## 2023-07-14 NOTE — Progress Notes (Signed)
 New patient visit   Patient: Robin Meza   DOB: 12-30-1965   58 y.o. Female  MRN: [id number] Visit Date: 07/14/2023  Today's healthcare provider: Bernice GORMAN Juneau, DO   Chief Complaint  Patient presents with   New Patient (Initial Visit)    Establish Care    SUBJECTIVE    Chief Complaint  Patient presents with   New Patient (Initial Visit)    Establish Care   HPI HPI     New Patient (Initial Visit)    Additional comments: Establish Care      Last edited by Duwaine Riggs, CMA on 07/14/2023 11:29 AM.      Pt presents to establish care. There is some confusion since she was seeing [facility] and recently saw them last week however pt says she just moved to [place] and does not want to go [place] for care. She has multiple medical conditions.  Pmh  HTN - on lisinopril    Prediabetes - on mounjaro   CAD - followed by [facility]   CHF  - preserved EF  - on furosemide   COPD  - managed by [facility]   Lumbar spondylosis  - followed by [facility]   Current smoker   Obesity   Stage 3a CKD   Needs mammogram Had colonoscopy last year and due in 9 years  Review of Systems  Constitutional:  Negative for diaphoresis, fever and unexpected weight change.  HENT:  Negative for hearing loss, postnasal drip, sneezing and tinnitus.   Eyes:  Negative for visual disturbance.  Respiratory:  Negative for cough and wheezing.   Cardiovascular:  Negative for chest pain and palpitations.  Genitourinary:  Negative for vaginal bleeding and vaginal discharge.  Musculoskeletal:  Negative for arthralgias.  Neurological:  Negative for headaches.  Hematological:  Negative for adenopathy. Does not bruise/bleed easily.       Current Meds  Medication Sig   albuterol (VENTOLIN HFA) 108 (90 Base) MCG/ACT inhaler Inhale into the lungs.   aspirin EC 81 MG tablet Take 1 tablet by mouth daily.   atorvastatin (LIPITOR) 80 MG tablet Take  80 mg by mouth daily.   Biotin 1000 MCG tablet Take 1,000 mcg by mouth daily.   cholecalciferol (VITAMIN D3) 25 MCG (1000 UNIT) tablet Take 1 tablet by mouth daily.   Docusate Calcium (STOOL SOFTENER PO) Take 1 tablet by mouth daily.   empagliflozin  (JARDIANCE ) 10 MG TABS tablet Take 10 mg by mouth daily.   furosemide (LASIX) 80 MG tablet Take 80 mg by mouth daily.   hydrochlorothiazide  (HYDRODIURIL ) 25 MG tablet Take 25 mg by mouth daily.   lisinopril  (ZESTRIL ) 5 MG tablet Take 5 mg by mouth daily.   MOUNJARO  7.5 MG/0.5ML Pen Inject 7.5 mg into the skin once a week.   PRALUENT 150 MG/ML SOAJ Inject 150 mg into the skin every 14 (fourteen) days.    OBJECTIVE    BP 97/64 (BP Location: Left Arm, Patient Position: Sitting, Cuff Size: Large)   Pulse 90   Ht 5' 3 (1.6 m)   Wt 200 lb 4 oz (90.8 kg)   LMP 09/21/2010   SpO2 98%   BMI 35.47 kg/m   Physical Exam Vitals and nursing note reviewed.  Constitutional:      General: She is not in acute distress.    Appearance: Normal appearance.  HENT:     Head: Normocephalic and atraumatic.     Right Ear: External ear normal.  Left Ear: External ear normal.     Nose: Nose normal.  Eyes:     Conjunctiva/sclera: Conjunctivae normal.  Cardiovascular:     Rate and Rhythm: Normal rate and regular rhythm.  Pulmonary:     Effort: Pulmonary effort is normal.     Breath sounds: Normal breath sounds.  Neurological:     General: No focal deficit present.     Mental Status: She is alert and oriented to person, place, and time.        ASSESSMENT & PLAN    Problem List Items Addressed This Visit       Cardiovascular and Mediastinum   Chronic heart failure with preserved ejection fraction (HCC)   Pt followed by cards. Says she is currently taking furosemide 80mg  in the am and 40mg  in the pm but has had to take extra pills the last few days due to increased weight. I encouraged pt to reach out to cardiology and tell them this - bp is 97/64  which could be a result of her additionally furosemide  - she is asymptomatic in office today - on jardiance  for heart protection       Relevant Medications   PRALUENT 150 MG/ML SOAJ   aspirin EC 81 MG tablet   atorvastatin (LIPITOR) 80 MG tablet   furosemide (LASIX) 80 MG tablet   lisinopril  (ZESTRIL ) 5 MG tablet   Coronary artery disease involving native heart without angina pectoris   - hx of drug eluting stent: everolimus eluting coronary stent with implant 02/27/21 - followed by cardiology      Relevant Medications   PRALUENT 150 MG/ML SOAJ   aspirin EC 81 MG tablet   atorvastatin (LIPITOR) 80 MG tablet   furosemide (LASIX) 80 MG tablet   lisinopril  (ZESTRIL ) 5 MG tablet     Respiratory   Chronic obstructive pulmonary disease (HCC)   Followed by [facility] however pt does not mention any inhalers that she is on so I am curious how she is doing. No wheezing heard on exam today.      Relevant Medications   albuterol (VENTOLIN HFA) 108 (90 Base) MCG/ACT inhaler     Musculoskeletal and Integument   Lumbar spondylosis   Followed by [facility]      Relevant Medications   aspirin EC 81 MG tablet     Genitourinary   Stage 3a chronic kidney disease (HCC)   Discussed what CKD 3a is and what this means. She is on lisinopril  that will help with renal protection.  - recently had blood work two weeks ago, will need repeat in two months to evaluate with BMP and microalbumin/cr ratio        Other   Prediabetes - Primary   Somehow pt is on mounjaro  for prediabetes. I explained to her this is usually used with a diagnosis of diabetes. She is currently on the 7.5mg  and I suggested we keep her at the 7.5mg  dose until her next appointment since she is having trouble right now with fluid retention secondary to heart failure and I want to make sure we are getting accurate weights.      Tobacco use   - current smoker, not interested in quitting at this time      Other  Visit Diagnoses       Primary hypertension       Relevant Medications   PRALUENT 150 MG/ML SOAJ   aspirin EC 81 MG tablet   atorvastatin (LIPITOR) 80 MG tablet  furosemide (LASIX) 80 MG tablet   lisinopril  (ZESTRIL ) 5 MG tablet     Heart failure with preserved ejection fraction, unspecified HF chronicity (HCC)       Relevant Medications   PRALUENT 150 MG/ML SOAJ   aspirin EC 81 MG tablet   atorvastatin (LIPITOR) 80 MG tablet   furosemide (LASIX) 80 MG tablet   lisinopril  (ZESTRIL ) 5 MG tablet     CKD stage 3a, GFR 45-59 ml/min (HCC)          63 minutes spent in combination of precharting and reviewing old epic notes, office visit and documentation  Return in about 2 months (around 09/11/2023).      No orders of the defined types were placed in this encounter.   No orders of the defined types were placed in this encounter.    Bernice GORMAN Juneau, DO  [facility] at [facility] [phone] (phone) [phone] (fax)  [facility]

## 2023-07-14 NOTE — Assessment & Plan Note (Signed)
 Somehow pt is on mounjaro  for prediabetes. I explained to her this is usually used with a diagnosis of diabetes. She is currently on the 7.5mg  and I suggested we keep her at the 7.5mg  dose until her next appointment since she is having trouble right now with fluid retention secondary to heart failure and I want to make sure we are getting accurate weights.

## 2023-07-14 NOTE — Assessment & Plan Note (Signed)
 Followed by [facility] however pt does not mention any inhalers that she is on so I am curious how she is doing. No wheezing heard on exam today.

## 2023-07-14 NOTE — Assessment & Plan Note (Signed)
-   hx of drug eluting stent: everolimus eluting coronary stent with implant 02/27/21 - followed by cardiology

## 2023-07-14 NOTE — Assessment & Plan Note (Signed)
 Pt followed by cards. Says she is currently taking furosemide 80mg  in the am and 40mg  in the pm but has had to take extra pills the last few days due to increased weight. I encouraged pt to reach out to cardiology and tell them this - bp is 97/64 which could be a result of her additionally furosemide  - she is asymptomatic in office today - on jardiance  for heart protection

## 2023-07-14 NOTE — Assessment & Plan Note (Signed)
 Followed by [employer]

## 2023-07-14 NOTE — Assessment & Plan Note (Signed)
-   current smoker, not interested in quitting at this time

## 2023-07-20 ENCOUNTER — Telehealth: Payer: Self-pay | Admitting: Family Medicine

## 2023-07-20 NOTE — Telephone Encounter (Signed)
 Copied from CRM [id number]. Topic: Clinical - Medical Advice >> Jul 20, 2023  9:17 AM Nestora J wrote: Reason for CRM: Pt wants to know if Dr. Bevin could set her up with a home health aid. She said she doesn't know the process or how it works and would like for someone to give her a call back. [phone]

## 2023-07-22 ENCOUNTER — Other Ambulatory Visit: Payer: Self-pay | Admitting: Family Medicine

## 2023-07-27 LAB — HM MAMMOGRAPHY

## 2023-08-03 ENCOUNTER — Telehealth: Payer: Self-pay

## 2023-08-03 NOTE — Telephone Encounter (Signed)
Copied from CRM [id number]. Topic: Clinical - Medical Advice >> Aug 03, 2023 12:45 PM Nila Nephew wrote: Reason for CRM: Patient states that she has questions prior to her upcoming visit. Patient began to list things without much context, that list is as I gathered below.   Atorvastatin - Patient states that she is unsure of how to take atorvastatin concerning time of day and frequency? Flexeril and Buspar - Patient states she has flexril and buspar in her packet from the hospital and she hasn't taken this in years. Lasix - told to take 80mg  daily, 20 mg at night, additional 20mg  for weight gain of 1 or two pounds (?) Pregablin - kept the same Lisinopril - 10mg  one tablet daily, is concerned this is marked as changed but she is sure it was this way before her visit Saline for nebulizer - states she does not have this Nitroglycerin - would like to know if she has any refills, no refills and medication not active on her list Hydrocodone - taking for back pain but not on the summary, states it was not sent in to the pharmacy.  Patient appears to be confused about the medications she should be taking and how she should be taking them. It appears that she may have her hospital summary on-hand and it may not be updated or correct. If it is updated or correct, patient is unaware of instructions regarding her care at this time. Patient is requesting to speak to a provider or nurse prior to her appointment for clarity.  Patient would also like to know when she is able to be released from her quarantine imposed by the hospital.   Patient also has a form for completion regarding home health and will be bringing that in to her appointment.

## 2023-08-06 NOTE — Telephone Encounter (Signed)
 Patient informed.

## 2023-08-09 ENCOUNTER — Ambulatory Visit: Payer: 59

## 2023-08-09 ENCOUNTER — Encounter: Payer: Self-pay | Admitting: Family Medicine

## 2023-08-09 ENCOUNTER — Ambulatory Visit (INDEPENDENT_AMBULATORY_CARE_PROVIDER_SITE_OTHER): Payer: 59 | Admitting: Family Medicine

## 2023-08-09 VITALS — BP 89/69 | HR 78 | Ht 63.0 in | Wt 189.2 lb

## 2023-08-09 DIAGNOSIS — R051 Acute cough: Secondary | ICD-10-CM | POA: Insufficient documentation

## 2023-08-09 DIAGNOSIS — J441 Chronic obstructive pulmonary disease with (acute) exacerbation: Secondary | ICD-10-CM

## 2023-08-09 DIAGNOSIS — Z09 Encounter for follow-up examination after completed treatment for conditions other than malignant neoplasm: Secondary | ICD-10-CM | POA: Diagnosis not present

## 2023-08-09 DIAGNOSIS — I5032 Chronic diastolic (congestive) heart failure: Secondary | ICD-10-CM

## 2023-08-09 DIAGNOSIS — R059 Cough, unspecified: Secondary | ICD-10-CM

## 2023-08-09 MED ORDER — GUAIFENESIN-CODEINE 100-10 MG/5ML PO SOLN: 5.0000 mL | Freq: Every evening | ORAL | 0 refills | Status: AC | PRN

## 2023-08-09 MED ORDER — METHYLPREDNISOLONE 4 MG PO TBPK: ORAL_TABLET | ORAL | 0 refills | Status: DC

## 2023-08-09 MED ORDER — AMOXICILLIN-POT CLAVULANATE 875-125 MG PO TABS: 1.0000 | ORAL_TABLET | Freq: Two times a day (BID) | ORAL | 0 refills | Status: AC

## 2023-08-09 MED ORDER — BENZONATATE 100 MG PO CAPS: 100.0000 mg | ORAL_CAPSULE | Freq: Two times a day (BID) | ORAL | 0 refills | Status: DC | PRN

## 2023-08-09 NOTE — Assessment & Plan Note (Addendum)
Believe pt is still in a small exacerbation due to requirement of nebulizer treatment and continued increase in phlegm. Pt given azithromycin in the hospital. Will go ahead and start patient on augmentin and have given her a inhaler.

## 2023-08-09 NOTE — Progress Notes (Addendum)
Established patient visit   Patient: Robin Meza   DOB: [birth year]   58 y.o. Female  MRN: [id number] Visit Date: 08/09/2023  Today's healthcare provider: Charlton Amor, DO   Chief Complaint  Patient presents with   Hospitalization Follow-up    SUBJECTIVE    Chief Complaint  Patient presents with   Hospitalization Follow-up   HPI   Pt presents for hospital follow up. She was admitted ot [facility] for COPD exacerbation and acute hypoxic respiratory failure secondary to influenza A. She was given tamiflu, azithromycin, prednisone, and placed on 2L nasal cannula.   Today, pt's bp is low today. She does note taking her medications this am.   Also admits to chronic back pain. She sees physical therapy for this.   Cough since her illness. She is bringing up a greenish phlegm.   Review of Systems  Constitutional:  Negative for activity change, fatigue and fever.  HENT:  Positive for congestion.   Respiratory:  Positive for cough. Negative for shortness of breath.   Cardiovascular:  Negative for chest pain.  Gastrointestinal:  Negative for abdominal pain.  Genitourinary:  Negative for difficulty urinating.       Current Meds  Medication Sig   albuterol (VENTOLIN HFA) 108 (90 Base) MCG/ACT inhaler Inhale into the lungs.   amoxicillin-clavulanate (AUGMENTIN) 875-125 MG tablet Take 1 tablet by mouth 2 (two) times daily for 5 days.   aspirin EC 81 MG tablet Take 1 tablet by mouth daily.   atorvastatin (LIPITOR) 80 MG tablet Take 80 mg by mouth daily.   benzonatate (TESSALON) 100 MG capsule Take 1 capsule (100 mg total) by mouth 2 (two) times daily as needed for cough.   Biotin 1000 MCG tablet Take 1,000 mcg by mouth daily.   cholecalciferol (VITAMIN D3) 25 MCG (1000 UNIT) tablet Take 1 tablet by mouth daily.   Docusate Calcium (STOOL SOFTENER PO) Take 1 tablet by mouth daily.   empagliflozin (JARDIANCE) 10 MG TABS tablet Take 10 mg by mouth daily.   furosemide (LASIX) 80 MG  tablet Take 80 mg by mouth daily.   guaiFENesin-codeine 100-10 MG/5ML syrup Take 5 mLs by mouth at bedtime as needed for up to 7 days for cough. Discard excess   lisinopril (ZESTRIL) 5 MG tablet Take 5 mg by mouth daily.   methylPREDNISolone (MEDROL DOSEPAK) 4 MG TBPK tablet Follow instructions per pill pack   MOUNJARO 7.5 MG/0.5ML Pen Inject 7.5 mg into the skin once a week.   PRALUENT 150 MG/ML SOAJ Inject 150 mg into the skin every 14 (fourteen) days.    OBJECTIVE    BP (!) 89/69 (BP Location: Left Arm, Patient Position: Sitting, Cuff Size: Normal)   Pulse 78   Ht 5\' 3"  (1.6 m)   Wt 189 lb 4 oz (85.8 kg)   LMP 09/21/2010   SpO2 98%   BMI 33.52 kg/m   Physical Exam Vitals and nursing note reviewed.  Constitutional:      General: She is not in acute distress.    Appearance: Normal appearance.  HENT:     Head: Normocephalic and atraumatic.     Right Ear: External ear normal.     Left Ear: External ear normal.     Nose: Nose normal.  Eyes:     Conjunctiva/sclera: Conjunctivae normal.  Cardiovascular:     Rate and Rhythm: Normal rate and regular rhythm.  Pulmonary:     Effort: Pulmonary effort is normal.  Breath sounds: Normal breath sounds.  Neurological:     General: No focal deficit present.     Mental Status: She is alert and oriented to person, place, and time.  Psychiatric:        Mood and Affect: Mood normal.        Behavior: Behavior normal.        Thought Content: Thought content normal.        Judgment: Judgment normal.        ASSESSMENT & PLAN    Problem List Items Addressed This Visit       Cardiovascular and Mediastinum   Chronic heart failure with preserved ejection fraction (HCC)   Pt is taking furosemide 20mg  daily. BP is low, encouraged her to follow up with cardiology.       Relevant Orders   Ambulatory referral to Home Health     Respiratory   Chronic obstructive pulmonary disease (HCC)   Believe pt is still in a small exacerbation  due to requirement of nebulizer treatment and continued increase in phlegm. Pt given azithromycin in the hospital. Will go ahead and start patient on augmentin and have given her a inhaler.       Relevant Medications   methylPREDNISolone (MEDROL DOSEPAK) 4 MG TBPK tablet   benzonatate (TESSALON) 100 MG capsule   guaiFENesin-codeine 100-10 MG/5ML syrup   Other Relevant Orders   Ambulatory referral to Home Health   DG Chest 2 View     Other   Acute cough - Primary   She is using nebulizer treatment and having to use it every 4 hours. She is waking up at night to use this.  - will go ahead and order cxr to look for signs of pneumonia      Relevant Orders   Ambulatory referral to [facility] discharge follow-up   Pt presents for hospital follow up. Presents with hypotension so I have advised her to stop hydrochlorothiazide to see if that helps her blood pressure recover. Recommend increasing fluids as well.  - follow up in 2 weeks to monitor BP. - would like to keep her on the lisinopril 5mg  for renal protection. The hydrochlorothiazide seems like the easiest/ least dual purpose blood pressure medication to change       Return in about 2 weeks (around 08/23/2023).      Meds ordered this encounter  Medications   amoxicillin-clavulanate (AUGMENTIN) 875-125 MG tablet    Sig: Take 1 tablet by mouth 2 (two) times daily for 5 days.    Dispense:  10 tablet    Refill:  0   methylPREDNISolone (MEDROL DOSEPAK) 4 MG TBPK tablet    Sig: Follow instructions per pill pack    Dispense:  21 tablet    Refill:  0   benzonatate (TESSALON) 100 MG capsule    Sig: Take 1 capsule (100 mg total) by mouth 2 (two) times daily as needed for cough.    Dispense:  20 capsule    Refill:  0   guaiFENesin-codeine 100-10 MG/5ML syrup    Sig: Take 5 mLs by mouth at bedtime as needed for up to 7 days for cough. Discard excess    Dispense:  118 mL    Refill:  0    Orders Placed This Encounter   Procedures   DG Chest 2 View    Standing Status:   Future    Number of Occurrences:   1    Expiration Date:  08/08/2024    Preferred imaging location?:   [facility]    Reason for exam::   cough, rule out pneumonia    Release to patient:   Immediate   Ambulatory referral to Home Health    Referral Priority:   Routine    Referral Type:   Home Health Care    Referral Reason:   Specialty Services Required    Requested Specialty:   Home Health Services    Number of Visits Requested:   1     Charlton Amor, DO  [facility] [place] [phone] (phone) [phone] (fax)  [facility]

## 2023-08-09 NOTE — Assessment & Plan Note (Addendum)
Pt presents for hospital follow up. Presents with hypotension so I have advised her to stop hydrochlorothiazide to see if that helps her blood pressure recover. Recommend increasing fluids as well.  - follow up in 2 weeks to monitor BP. - would like to keep her on the lisinopril 5mg  for renal protection. The hydrochlorothiazide seems like the easiest/ least dual purpose blood pressure medication to change

## 2023-08-09 NOTE — Assessment & Plan Note (Addendum)
She is using nebulizer treatment and having to use it every 4 hours. She is waking up at night to use this.  - will go ahead and order cxr to look for signs of pneumonia

## 2023-08-09 NOTE — Patient Instructions (Addendum)
Stop hydrochlorothiazide   Start for your illness: Augmentin twice a day for 5 days  Medrol dose pack   For cough - use tesslon perles  - cough medicine at night--contains mucinex  ( If you need mucinex, take in in the morning only)   Drink fluids--gatorade, water, ginger ale (try to add one bottle of gatorade per day)

## 2023-08-09 NOTE — Assessment & Plan Note (Signed)
Pt is taking furosemide 20mg  daily. BP is low, encouraged her to follow up with cardiology.

## 2023-08-23 ENCOUNTER — Ambulatory Visit: Payer: 59 | Admitting: Family Medicine

## 2023-08-24 ENCOUNTER — Ambulatory Visit: Payer: 59 | Admitting: Family Medicine

## 2023-08-31 ENCOUNTER — Encounter: Payer: Self-pay | Admitting: Family Medicine

## 2023-08-31 ENCOUNTER — Ambulatory Visit (INDEPENDENT_AMBULATORY_CARE_PROVIDER_SITE_OTHER): Payer: 59 | Admitting: Family Medicine

## 2023-08-31 VITALS — BP 103/64 | HR 84 | Temp 98.2°F | Resp 18 | Ht 63.0 in | Wt 189.4 lb

## 2023-08-31 DIAGNOSIS — E661 Drug-induced obesity: Secondary | ICD-10-CM

## 2023-08-31 DIAGNOSIS — E66811 Obesity, class 1: Secondary | ICD-10-CM

## 2023-08-31 DIAGNOSIS — I10 Essential (primary) hypertension: Secondary | ICD-10-CM | POA: Diagnosis not present

## 2023-08-31 DIAGNOSIS — Z6833 Body mass index (BMI) 33.0-33.9, adult: Secondary | ICD-10-CM

## 2023-08-31 DIAGNOSIS — R7303 Prediabetes: Secondary | ICD-10-CM

## 2023-08-31 MED ORDER — LISINOPRIL 5 MG PO TABS: 5.0000 mg | ORAL_TABLET | Freq: Every day | ORAL | 0 refills | Status: DC

## 2023-08-31 MED ORDER — MOUNJARO 7.5 MG/0.5ML ~~LOC~~ SOAJ: 7.5000 mg | SUBCUTANEOUS | 0 refills | Status: DC

## 2023-08-31 NOTE — Progress Notes (Signed)
 Established Patient Office Visit  Subjective   Patient ID: Robin Meza, female    DOB: 1965-07-10  Age: 58 y.o. MRN: [id number]  Chief Complaint  Patient presents with   Follow-up    Patient is here for a two week follow up for hypotension  as well as a follow up from having the FLU. Patient states that she feels better other than a small cough     HPI  Hypertension Medication compliance: Taking lisinopril 5 mg daily as prescribed.  Denies chest pain, shortness of breath, lower extremity edema, vision changes, headaches.  Pertinent lab work: 08/02/23: GFR 67.  Monitoring at home: 109/70's Tolerating medication well: no side effects  Continue current medication regimen: no changes Follow-up:   Influenza:  Diagnosed with influenza 3 weeks ago. Cough is lingering. Chest x-ray on 08/09/23: negative  Heart Failure: sees cards next week with [facility].   Diabetes: on Mounjaro 7.5 mg weekly for one year. Will check A1C today.        ROS    Objective:     BP 103/64   Pulse 84   Temp 98.2 F (36.8 C) (Oral)   Resp 18   Ht 5\' 3"  (1.6 m)   Wt 189 lb 6.4 oz (85.9 kg)   LMP 09/21/2010   SpO2 96%   BMI 33.55 kg/m  BP Readings from Last 3 Encounters:  08/31/23 103/64  08/09/23 (!) 89/69  07/14/23 97/64      Physical Exam Vitals and nursing note reviewed.  Constitutional:      General: She is not in acute distress.    Appearance: Normal appearance.  Cardiovascular:     Rate and Rhythm: Normal rate and regular rhythm.  Pulmonary:     Effort: Pulmonary effort is normal.     Breath sounds: Normal breath sounds.  Skin:    General: Skin is warm and dry.  Neurological:     General: No focal deficit present.     Mental Status: She is alert. Mental status is at baseline.  Psychiatric:        Mood and Affect: Mood normal.        Behavior: Behavior normal.        Thought Content: Thought content normal.        Judgment: Judgment normal.     No results  found for any visits on 08/31/23.  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 Tolerating well with no reported side effects. Refill sent.       Relevant Medications   MOUNJARO 7.5 MG/0.5ML Pen   Prediabetes   Last  A1C 5.5. Will recheck today. Mounjaro 7.5 mg weekly as prescribed. No side effects reported. Does not wish to increase dose due to potential side effects.        Relevant Medications   MOUNJARO 7.5 MG/0.5ML Pen   Other Relevant Orders   Hemoglobin A1c   Primary hypertension - Primary   Blood pressure has improved since being off of hydrochlorothiazide 25 mg. She was hypotensive at last PCP visit.  103/64 in the office today. Tends to run low at baseline. Continue lisinopril 5 mg daily as prescribed. Monitor at home. Follow-up in March as scheduled with this provider.        Relevant Medications   lisinopril (ZESTRIL) 5 MG tablet  Agrees with plan of care discussed.  Questions answered.   Return in about 3 months (around 12/06/2023) for HTN with PCP .    Novella Olive, FNP

## 2023-08-31 NOTE — Assessment & Plan Note (Addendum)
 Blood pressure has improved since being off of hydrochlorothiazide 25 mg. She was hypotensive at last PCP visit.  103/64 in the office today. Tends to run low at baseline. Continue lisinopril 5 mg daily as prescribed. Monitor at home. Follow-up in March as scheduled with this provider.

## 2023-08-31 NOTE — Assessment & Plan Note (Signed)
 Last A1C 5.5. Will recheck today. Mounjaro 7.5 mg weekly as prescribed. No side effects reported. Does not wish to increase dose due to potential side effects.

## 2023-08-31 NOTE — Assessment & Plan Note (Signed)
 Does not report weight loss on Mounjaro 7.5 mg. Tolerating well with no reported side effects. Refill sent.

## 2023-08-31 NOTE — Assessment & Plan Note (Deleted)
 Does not report weight loss on Mounjaro 7.5 mg. Tolerating well with no reported side effects. Refill sent.

## 2023-09-01 ENCOUNTER — Encounter: Payer: Self-pay | Admitting: Family Medicine

## 2023-09-01 LAB — HEMOGLOBIN A1C
Est. average glucose Bld gHb Est-mCnc: 117 mg/dL
Hgb A1c MFr Bld: 5.7 % — ABNORMAL HIGH (ref 4.8–5.6)

## 2023-09-02 ENCOUNTER — Other Ambulatory Visit: Payer: Self-pay | Admitting: Family Medicine

## 2023-09-02 NOTE — Telephone Encounter (Signed)
 I think probably not, looking back she was hypotensive with Dr. Tamera Punt and her blood pressure was quite low at her visit with Clementeen Hoof, NP.  She needs a dedicated visit for blood pressure at this time to determine if she really still needs the furosemide.  In addition this dose does not match the dose in her chart.

## 2023-09-08 ENCOUNTER — Ambulatory Visit: Payer: 59

## 2023-09-08 ENCOUNTER — Telehealth: Payer: Self-pay

## 2023-09-08 NOTE — Telephone Encounter (Signed)
 Copied from CRM [id number]. Topic: Clinical - Medication Question >> Sep 07, 2023  3:49 PM Maree Krabbe H wrote: Reason for CRM: Megan Salon S called from [facility] and wanted to know was the patient denied for her rx furosemide (LASIX) 80 MG tablet and if she needs to be seen before they can send the rx. Provider line callback number is [phone] ref number [id number] you can leave a secure voicemail to answer the question if you don't want to speak to someone which every you prefer.

## 2023-09-08 NOTE — Telephone Encounter (Signed)
 Forwarding message to [employer] covering [employer]

## 2023-09-14 ENCOUNTER — Ambulatory Visit (INDEPENDENT_AMBULATORY_CARE_PROVIDER_SITE_OTHER): Payer: 59 | Admitting: Family Medicine

## 2023-09-14 ENCOUNTER — Encounter: Payer: Self-pay | Admitting: Family Medicine

## 2023-09-14 VITALS — BP 114/70 | HR 83 | Ht 63.0 in | Wt 188.5 lb

## 2023-09-14 DIAGNOSIS — M25571 Pain in right ankle and joints of right foot: Secondary | ICD-10-CM | POA: Diagnosis not present

## 2023-09-14 DIAGNOSIS — I10 Essential (primary) hypertension: Secondary | ICD-10-CM

## 2023-09-14 DIAGNOSIS — I5032 Chronic diastolic (congestive) heart failure: Secondary | ICD-10-CM | POA: Diagnosis not present

## 2023-09-14 NOTE — Progress Notes (Signed)
 Established Patient Office Visit  Subjective   Patient ID: Robin Meza, female    DOB: [birth year]  Age: 58 y.o. MRN: [id number]  Chief Complaint  Patient presents with   low blood  pressure readings     Low blood pressure reading Dr. Tamera Punt follow up - and [facility] wanted to should continue the Lasix due to low reading -    right ankle pain    Right ankle pain x couple weeks had fall awhile ago with dog or could be related to cardio class that she takes per pt.    check on home health referral     HPI  Hypertension Medication compliance: Taking lisinopril 5 mg daily as prescribed  Denies chest pain, shortness of breath, lower extremity edema, vision changes, headaches.  Pertinent lab work: 08/02/23 Monitoring at home: has not been checking due to low batteries  Tolerating medication well: no side effects reported Continue current medication regimen: wil discuss with cardiology  concerning dose Follow-up: 3 months  Taking lasix 20 mg daily as prescribed per cardiology.  Was taken off of Lasix after being in the hospital for the flu. Weight is stable, one pound less than last viist.   Right ankle: injury 2 weeks ago. Fell. Decreased ROM with some tenderness with palpation.   Home health: Referral specialist notified and will check on status of home health referral placed by PCP.  Chart review:  December cards visit: lisinopril dose decreased to 2.5 mg, she is taking 5 mg now. She is unsure of how she got back to 5 mg of lisinopril.      Review of Systems  Respiratory:  Negative for shortness of breath.   Cardiovascular:  Negative for leg swelling.  Musculoskeletal:  Positive for falls and joint pain (right ankle).      Objective:     BP 114/70   Pulse 83   Ht 5\' 3"  (1.6 m)   Wt 188 lb 8 oz (85.5 kg)   LMP 09/21/2010   SpO2 95%   BMI 33.39 kg/m  BP Readings from Last 3 Encounters:  09/14/23 114/70  08/31/23 103/64  08/09/23 (!) 89/69      Physical  Exam Vitals and nursing note reviewed.  Constitutional:      General: She is not in acute distress.    Appearance: Normal appearance.  Cardiovascular:     Rate and Rhythm: Regular rhythm.     Heart sounds: Normal heart sounds.  Pulmonary:     Effort: Pulmonary effort is normal.     Breath sounds: Normal breath sounds.  Musculoskeletal:        General: Tenderness and signs of injury present.     Comments: Right ankle injury. Decreased ROM and some tenderness with palpation.   Skin:    General: Skin is warm and dry.  Neurological:     General: No focal deficit present.     Mental Status: She is alert. Mental status is at baseline.  Psychiatric:        Mood and Affect: Mood normal.        Behavior: Behavior normal.        Thought Content: Thought content normal.        Judgment: Judgment normal.      No results found for any visits on 09/14/23.    The ASCVD Risk score (Arnett DK, et al., 2019) failed to calculate for the following reasons:   The valid total cholesterol range is 130 to 320  mg/dL    Assessment & Plan:   Problem List Items Addressed This Visit     Chronic heart failure with preserved ejection fraction (HCC)   History of congestive heart failure, prescribed furosemide per cardiology.  Patient is currently taking furosemide 20 mg daily.  Upon chart review it appears that cardiology has instructed her to take furosemide 80 mg as needed.  Patient is not experiencing any shortness of breath, no lower extremity edema currently. Weight is stable, she weighed 189 pounds on 2/25, 188 pounds today.  She will also discuss with cardiology in 2 days concerning appropriate dose of furosemide.  Her PCP discontinued her hydrochlorothiazide due to low blood pressure in the past.      Primary hypertension - Primary   Taking lisinopril 5 mg daily as prescribed.  Denies chest pain, shortness of breath, lower extremity edema, vision changes and headaches.  She does not check her  blood pressure at home due to low batteries on her machine.  She is scheduled to see cardiologist in 2 days.  No side effects reported of lisinopril.  Upon chart review, it is noted that in December, her blood pressure was less than 100 systolically and her cardiologist decreased her lisinopril to 2.5 mg.  Then 1 week later, refill request lisinopril was instructed to be taking at 5 mg.  There is some confusion as to the correct dose cardiology wants patient to take.  She has a follow-up cardiology appointment on 3/13, written instructions were given to discuss proper dose of lisinopril.  Today blood pressure is well-controlled and 114/70.  Follow-up in 3 months with PCP, sooner if needed.      Acute right ankle pain   Reports falling 2 weeks ago with continued right ankle pain.  There is slight decrease in range of motion, and some tenderness with palpation.  There is no swelling noted. She is walking without difficulty.  She will go get an x-ray of the right ankle, Ace wrap provided for support.  Follow-up based on x-ray findings.      Relevant Orders   DG Ankle Complete Right  Agrees with plan of care discussed.  Questions answered.  Total time face to face and reviewing cardiology notes: 38 minutes.   Return in about 3 months (around 12/15/2023) for HTN with PCP .    Novella Olive, FNP

## 2023-09-14 NOTE — Patient Instructions (Addendum)
 Discuss with cardiology about the dose of lisinopril you should be taking. 2.5 mg or 5 mg Cardiology will review the Lasix with you.   [facility]  [address]  The radiology department is on the first floor which is best accessed by going around to the back of the building. No appointment necessary. You can go at your convenience.

## 2023-09-14 NOTE — Assessment & Plan Note (Addendum)
 Taking lisinopril 5 mg daily as prescribed.  Denies chest pain, shortness of breath, lower extremity edema, vision changes and headaches.  She does not check her blood pressure at home due to low batteries on her machine.  She is scheduled to see cardiologist in 2 days.  No side effects reported of lisinopril.  Upon chart review, it is noted that in December, her blood pressure was less than 100 systolically and her cardiologist decreased her lisinopril to 2.5 mg.  Then 1 week later, refill request lisinopril was instructed to be taking at 5 mg.  There is some confusion as to the correct dose cardiology wants patient to take.  She has a follow-up cardiology appointment on 3/13, written instructions were given to discuss proper dose of lisinopril.  Today blood pressure is well-controlled and 114/70.  Follow-up in 3 months with PCP, sooner if needed.

## 2023-09-14 NOTE — Assessment & Plan Note (Addendum)
 Reports falling 2 weeks ago with continued right ankle pain.  There is slight decrease in range of motion, and some tenderness with palpation.  There is no swelling noted. She is walking without difficulty.  She will go get an x-ray of the right ankle, Ace wrap provided for support.  Follow-up based on x-ray findings.

## 2023-09-14 NOTE — Assessment & Plan Note (Addendum)
 History of congestive heart failure, prescribed furosemide per cardiology.  Patient is currently taking furosemide 20 mg daily.  Upon chart review it appears that cardiology has instructed her to take furosemide 80 mg as needed.  Patient is not experiencing any shortness of breath, no lower extremity edema currently. Weight is stable, she weighed 189 pounds on 2/25, 188 pounds today.  She will also discuss with cardiology in 2 days concerning appropriate dose of furosemide.  Her PCP discontinued her hydrochlorothiazide due to low blood pressure in the past.

## 2023-09-17 ENCOUNTER — Ambulatory Visit

## 2023-09-17 DIAGNOSIS — M25571 Pain in right ankle and joints of right foot: Secondary | ICD-10-CM

## 2023-09-22 ENCOUNTER — Telehealth: Payer: Self-pay | Admitting: Family Medicine

## 2023-09-22 NOTE — Telephone Encounter (Signed)
 Copied from CRM [id number]. Topic: Clinical - Medication Refill >> Sep 22, 2023  5:01 PM Priscille Loveless wrote:   Most Recent Primary Care Visit:  Provider: Novella Olive  Department: [facility]  Visit Type: OFFICE VISIT  Date: 09/14/2023  Medication: furosemide (LASIX) 20 MG tablet per pt Dr Bettey Mare, Cardiologist wants to cut her back to 20 mg of the lasix. Pt thought the cardiologist would of advised her of change. Pt advised 80 mg is on med list but that is incorrect, it is now 20 mg.   Has the patient contacted their pharmacy? Yes  Is this the correct pharmacy for this prescription? Yes  This is the patient's preferred pharmacy:   [facility] [place] - [address] [place] [address] Phone: [phone] Fax: [phone]  Has the prescription been filled recently? Yes  Is the patient out of the medication? Yes  Has the patient been seen for an appointment in the last year OR does the patient have an upcoming appointment? Yes  Can we respond through MyChart? Yes  Agent: Please be advised that Rx refills may take up to 3 business days. We ask that you follow-up with your pharmacy.

## 2023-09-22 NOTE — Telephone Encounter (Signed)
 Copied from CRM [id number]. Topic: Clinical - Medication Refill >> Sep 22, 2023  5:01 PM Priscille Loveless wrote: Cardiologist changed dosage  Most Recent Primary Care Visit:  Provider: Novella Olive  Department: [facility] AT [facility]  Visit Type: OFFICE VISIT  Date: 09/14/2023  Medication: furosemide (LASIX) 80 MG tablet per pt Dr Bettey Mare, Cardiologist wants to cut her back to 20 mg of the lasix. Pt thought the cardiologist would of advised her of change.   Has the patient contacted their pharmacy? Yes  Is this the correct pharmacy for this prescription?   This is the patient's preferred pharmacy:   [facility] [place], [place] - [address] [place] [address] Phone: [phone] Fax: [phone]  Has the prescription been filled recently? Yes  Is the patient out of the medication? Yes  Has the patient been seen for an appointment in the last year OR does the patient have an upcoming appointment? Yes  Can we respond through MyChart? Yes  Agent: Please be advised that Rx refills may take up to 3 business days. We ask that you follow-up with your pharmacy.

## 2023-09-30 ENCOUNTER — Other Ambulatory Visit: Payer: Self-pay | Admitting: Family Medicine

## 2023-09-30 NOTE — Telephone Encounter (Signed)
 Spoke with patient. She does not need a refill of lasix 20mg  sent to pharmacy. She was only trying to inform us about the change by cariology.  She states she has directions to take an extra 20mg  lasix if weight  gain is 1# to 3 #

## 2023-10-03 ENCOUNTER — Other Ambulatory Visit: Payer: Self-pay | Admitting: Family Medicine

## 2023-10-03 ENCOUNTER — Encounter: Payer: Self-pay | Admitting: Family Medicine

## 2023-10-03 DIAGNOSIS — M7750 Other enthesopathy of unspecified foot: Secondary | ICD-10-CM | POA: Insufficient documentation

## 2023-10-06 ENCOUNTER — Other Ambulatory Visit: Payer: Self-pay | Admitting: Family Medicine

## 2023-10-06 ENCOUNTER — Telehealth: Payer: Self-pay | Admitting: Family Medicine

## 2023-10-06 NOTE — Telephone Encounter (Signed)
Yes, I will work on it.

## 2023-10-06 NOTE — Telephone Encounter (Signed)
 Thank you Darien Ramus!  Have a nice day :)

## 2023-10-06 NOTE — Telephone Encounter (Signed)
 Copied from CRM [id number]. Topic: Referral - Status >> Oct 06, 2023  1:09 PM Alfred Levins wrote:   Patient calling on the status of her home care service request and she also left a number a number of a company called [employer] [phone]. Please advise Can you follow up on this?

## 2023-10-08 ENCOUNTER — Other Ambulatory Visit: Payer: Self-pay | Admitting: Family Medicine

## 2023-10-08 NOTE — Telephone Encounter (Signed)
 Copied from CRM [id number]. Topic: Clinical - Medication Refill >> Oct 08, 2023 10:37 AM Alfred Levins wrote: Most Recent Primary Care Visit:  Provider: Novella Olive  Department: [facility] AT [facility]  Visit Type: OFFICE VISIT  Date: 09/14/2023  Medication: albuterol (VENTOLIN HFA) 108 (90 Base) MCG/ACT inhaler    [facility] [place] [place], [place] - [address] [place] [address] Phone: [phone] Fax: [phone]   Ref# [id number]

## 2023-10-11 NOTE — Telephone Encounter (Signed)
 [facility] is requesting med refill for Flovent. Rx not listed in active med list. Written by historical provider.

## 2023-10-11 NOTE — Telephone Encounter (Signed)
 Hi vanicia, I saw the refill request for albuterol but not Flovent.  I signed off on the albuterol.

## 2023-10-29 ENCOUNTER — Ambulatory Visit: Payer: Self-pay

## 2023-10-29 NOTE — Telephone Encounter (Signed)
  Chief Complaint: generalized body pain Symptoms: back and leg pain Frequency: chronic pain Pertinent Negatives: Patient denies SOB, CP Disposition: [] ED /[] Urgent Care (no appt availability in office) / [x] Appointment(In office/virtual)/ []  [place] [facility]/ [] Home Care/ [] Refused Recommended Disposition /[] [place] [facility] [facility]/ []  Follow-up with PCP Additional Notes: patient calling with concerns for increases in back and leg pain. Patient reports having difficulty with ambulation and pain while sitting. Patient states she is having difficulty getting to the bathroom. Prior to transfer to Nurse Triage, agent helped patient with an update on home health aide referral. Patient requesting an appointment with PCP. Patient's PCP is still on maternity leave but patient was scheduled for acute appointment with another provider for Monday November 01, 2023 at 2:30 PM. Patient verbalized understanding of pain and all questions answered.    Copied from CRM [id number]. Topic: Clinical - Red Word Triage >> Oct 29, 2023  4:18 PM Jayson Michael wrote: [facility] that prompted transfer to Nurse Triage: patient initially called in to get an update on her home health aide referral, while discussing the DME she mentioned that she is in severe pain and have a lot of difficulty ambulating, she is not making it to the bathroom on time and severe pain while sitting.  Patient does have a history of chronic pain in her leg and back and  is wanting to get an appointment with her PCP, but is not sure if she is still on maternity leave, and who her appointment would be with.  Patient reported increasing their lyrica dosage to 4 tablets a day. Reason for Disposition  [1] MODERATE pain (e.g., interferes with normal activities) AND [2] present > 3 days  Answer Assessment - Initial Assessment Questions 1. ONSET: "When did the muscle aches or body pains start?"      Chronic body pain 2. LOCATION: "What part of your body is  hurting?" (e.g., entire body, arms, legs)      Lower back pain 3. SEVERITY: "How bad is the pain?" (Scale 1-10; or mild, moderate, severe)   - MILD (1-3): doesn't interfere with normal activities    - MODERATE (4-7): interferes with normal activities or awakens from sleep    - SEVERE (8-10):  excruciating pain, unable to do any normal activities      8 out of 10 4. CAUSE: "What do you think is causing the pains?"     Chronic back pain 5. FEVER: "Have you been having fever?"     no 6. OTHER SYMPTOMS: "Do you have any other symptoms?" (e.g., chest pain, weakness, rash, cold or flu symptoms, weight loss)     Weakness, difficulty ambulating, rash on hands  Protocols used: Muscle Aches and Body Pain-A-AH

## 2023-10-29 NOTE — Telephone Encounter (Signed)
 Copied from CRM [id number]. Topic: Referral - Status >> Oct 29, 2023  4:17 PM Jayson Michael wrote: Patient spoke with the home health aide company [employer] and got an expedited [facility] #[phone] patient is having a lot of trouble ambulating is request DME to raise her toiler higher.  She is having a difficulties making it to the bathroom on time and its very painful to while sitting. She stated her Tens Unit does not give her any relief. She also stated that her Lyrica has been increased before and is now taking 2 in the morning and 2 at night (patient reported)  transferring to nurse for the severe pain but she is wanting a callback in regards to the DME [phone]

## 2023-11-01 ENCOUNTER — Encounter: Payer: Self-pay | Admitting: Family Medicine

## 2023-11-01 ENCOUNTER — Ambulatory Visit (INDEPENDENT_AMBULATORY_CARE_PROVIDER_SITE_OTHER): Admitting: Family Medicine

## 2023-11-01 VITALS — BP 99/64 | HR 84 | Ht 63.0 in | Wt 188.0 lb

## 2023-11-01 DIAGNOSIS — M4317 Spondylolisthesis, lumbosacral region: Secondary | ICD-10-CM

## 2023-11-01 MED ORDER — PREGABALIN 100 MG PO CAPS: 100.0000 mg | ORAL_CAPSULE | Freq: Three times a day (TID) | ORAL | 0 refills | Status: AC

## 2023-11-01 MED ORDER — PREDNISONE 10 MG (48) PO TBPK: ORAL_TABLET | Freq: Every day | ORAL | 0 refills | Status: DC

## 2023-11-01 NOTE — Progress Notes (Signed)
 Robin Meza - 58 y.o. female MRN [id number]  Date of birth: 02-19-1966  Subjective Chief Complaint  Patient presents with   Back Pain   Sciatica    HPI Robin Meza is a 58 y.o. female with history of lumbar spondylosis and spondylolisthesis at L5-S1 here today with complaint of back pain.  She has had this for quite some time.  Seen by Wilma Has and had imaging showing facet arthropathy and L5 rood compression .  She is having radiation down the L leg.  Denies bowel or bladder incontinence.  Injection was recommended but she declines for now.  She is using lyrica 75mg  qid.  She was using 2qam and 2pm but reports this was stopped.  She feels like 150mg  BID worked better for her.  She also has meloxicam.    ROS:  A comprehensive ROS was completed and negative except as noted per HPI  Allergies  Allergen Reactions   Aspirin Anaphylaxis   Sulfa Antibiotics Rash    Severe: Rash to palms and feet   Latex Rash   Bee Pollen Other (See Comments)   Pollen Extract    Pork Allergy Nausea Only   Sulfamethoxazole-Trimethoprim Rash    Hives   Hives   Tomato Rash    Past Medical History:  Diagnosis Date   Arthritis    Asthma    Gout     Past Surgical History:  Procedure Laterality Date   CESAREAN SECTION      Social History   Socioeconomic History   Marital status: Widowed    Spouse name: Not on file   Number of children: Not on file   Years of education: Not on file   Highest education level: Not on file  Occupational History   Not on file  Tobacco Use   Smoking status: Some Days    Current packs/day: 1.00    Types: Cigarettes   Smokeless tobacco: Current  Substance and Sexual Activity   Alcohol use: No   Drug use: Not on file   Sexual activity: Not on file  Other Topics Concern   Not on file  Social History Narrative   Not on file   Social Drivers of Health   Financial Resource Strain: Low Risk  (08/14/2020)   Received from [facility]   Overall  Financial Resource Strain (CARDIA)    Difficulty of Paying Living Expenses: Not hard at all  Food Insecurity: Food Insecurity Present (08/01/2023)   Received from [facility]   Hunger Vital Sign    Worried About Running Out of Food in the Last Year: Sometimes true    Ran Out of Food in the Last Year: Never true  Transportation Needs: No Transportation Needs (08/01/2023)   Received from [facility] - Transportation    Lack of Transportation (Medical): No    Lack of Transportation (Non-Medical): No  Physical Activity: Not on file  Stress: No Stress Concern Present (08/01/2023)   Received from [facility] - Occupational Stress Questionnaire    Feeling of Stress : Only a little  Social Connections: Unknown (11/09/2021)   Received from [facility]   Social Network    Social Network: Not on file    No family history on file.  Health Maintenance  Topic Date Due   HIV Screening  Never done   Hepatitis C Screening  Never done   Colonoscopy  Never done   Zoster Vaccines- Shingrix (1 of 2)  12/15/2023 (Originally 07/23/2014)   DTaP/Tdap/Td (1 - Tdap) 09/13/2024 (Originally 07/24/1983)   INFLUENZA VACCINE  02/04/2024   Medicare Annual Wellness (AWV)  06/27/2024   MAMMOGRAM  07/26/2025   Cervical Cancer Screening (HPV/Pap Cotest)  06/27/2026   Pneumococcal Vaccine 11-41 Years old  Completed   COVID-19 Vaccine  Completed   HPV VACCINES  Aged Out   Meningococcal B Vaccine  Aged Out     ----------------------------------------------------------------------------------------------------------------------------------------------------------------------------------------------------------------- Physical Exam BP 99/64 (BP Location: Left Arm, Patient Position: Sitting, Cuff Size: Normal)   Pulse 84   Ht 5\' 3"  (1.6 m)   Wt 188 lb (85.3 kg)   LMP 09/21/2010   SpO2 97%   BMI 33.30 kg/m   Physical Exam Constitutional:       Appearance: Normal appearance.  Neurological:     Mental Status: She is alert.     Comments: Antalgic gait, slightly hunched over.  Using cance     ------------------------------------------------------------------------------------------------------------------------------------------------------------------------------------------------------------------- Assessment and Plan  Spondylolisthesis at L5-S1 level She is followed by [facility] as well.  Adding prednisone taper.  Change lyrica to 100mg  TID.  Red flags reviewed.    Meds ordered this encounter  Medications   pregabalin (LYRICA) 100 MG capsule    Sig: Take 1 capsule (100 mg total) by mouth 3 (three) times daily. Patient states that she takes 4 x's daily    Dispense:  90 capsule    Refill:  0   predniSONE (STERAPRED UNI-PAK 48 TAB) 10 MG (48) TBPK tablet    Sig: Take by mouth daily. 12-day taper pack, use as directed for taper    Dispense:  1 tablet    Refill:  0    No follow-ups on file.

## 2023-11-01 NOTE — Telephone Encounter (Signed)
 Called [facility] regarding toilet being set higher.

## 2023-11-01 NOTE — Assessment & Plan Note (Signed)
 She is followed by [facility] as well.  Adding prednisone taper.  Change lyrica to 100mg  TID.  Red flags reviewed.

## 2023-11-09 NOTE — Telephone Encounter (Signed)
 Copied from CRM [id number]. Topic: Referral - Request for Referral >> Nov 09, 2023  2:07 PM Karole Pacer C wrote: Did the patient discuss referral with their provider in the last year? Yes (If No - schedule appointment) (If Yes - send message)  Appointment offered? No  Type of order/referral and detailed reason for visit: Home Health services, patient has fallen in her home and has back problems  Preference of office, provider, location: [place] [facility] Patient states it's a service provided by [facility] where she is able to select a aid to come into her home. [place] [employer] [id number]  Please fax referral back to this # [phone] patient would like for Dr. Dianah Fort to provide recommendations for home health services as well.   If referral order, have you been seen by this specialty before? Yes (If Yes, this issue or another issue? When? Where?  Can we respond through MyChart? Yes

## 2023-11-09 NOTE — Telephone Encounter (Signed)
 Routing to Dr. Augustus Ledger.  I know saw pt about 2 weeks ago. Does he feel this is appropriate

## 2023-11-11 ENCOUNTER — Encounter: Payer: Self-pay | Admitting: Family Medicine

## 2023-11-12 NOTE — Telephone Encounter (Signed)
 Per the patient, she completed physical therapy and was discharged last month. Her sessions were once a week for 8 weeks at [facility]. Patient states she is still having excruciating pain on the left side of her back. Please advise, thanks.

## 2023-11-12 NOTE — Telephone Encounter (Signed)
 Task completed. Patient has been updated of the provider's note. She will contact [facility] for a follow up on her HHA referral.

## 2023-11-16 ENCOUNTER — Encounter: Payer: Self-pay | Admitting: Family Medicine

## 2023-11-16 ENCOUNTER — Telehealth: Payer: Self-pay

## 2023-11-16 NOTE — Telephone Encounter (Signed)
 Copied from CRM [id number]. Topic: General - Other >> Nov 16, 2023  3:49 PM Marissa P wrote: Reason for CRM: Patient called saying there was an attempt for an appt to be made for [facility] and she cannot go there. Please follow up with patient [phone] [facility])

## 2023-11-17 ENCOUNTER — Ambulatory Visit (HOSPITAL_BASED_OUTPATIENT_CLINIC_OR_DEPARTMENT_OTHER): Admitting: Student

## 2023-11-18 ENCOUNTER — Ambulatory Visit (INDEPENDENT_AMBULATORY_CARE_PROVIDER_SITE_OTHER): Admitting: Family Medicine

## 2023-11-18 ENCOUNTER — Ambulatory Visit

## 2023-11-18 ENCOUNTER — Other Ambulatory Visit: Payer: Self-pay | Admitting: Family Medicine

## 2023-11-18 ENCOUNTER — Ambulatory Visit: Payer: Self-pay | Admitting: Family Medicine

## 2023-11-18 ENCOUNTER — Encounter: Payer: Self-pay | Admitting: Family Medicine

## 2023-11-18 VITALS — BP 143/71 | HR 85 | Temp 97.9°F | Resp 18 | Ht 63.0 in | Wt 193.2 lb

## 2023-11-18 DIAGNOSIS — M545 Low back pain, unspecified: Secondary | ICD-10-CM | POA: Insufficient documentation

## 2023-11-18 DIAGNOSIS — M79661 Pain in right lower leg: Secondary | ICD-10-CM | POA: Insufficient documentation

## 2023-11-18 DIAGNOSIS — G8929 Other chronic pain: Secondary | ICD-10-CM | POA: Insufficient documentation

## 2023-11-18 DIAGNOSIS — R600 Localized edema: Secondary | ICD-10-CM | POA: Insufficient documentation

## 2023-11-18 MED ORDER — HYDROCHLOROTHIAZIDE 12.5 MG PO CAPS: 12.5000 mg | ORAL_CAPSULE | Freq: Every day | ORAL | 0 refills | Status: DC

## 2023-11-18 NOTE — Assessment & Plan Note (Addendum)
 Significant right leg calf pain and swelling. She will go to ultrasound right away to rule out DVT. Ultrasound does not show DVT. Notified of results via phone call.  Recommend compression stockings, elevating legs. Blood pressure elevated today, hydrochlorothiazide 12.5 mg daily for next 10 days for lower leg edema. Follow-up if symptoms do not resolve.

## 2023-11-18 NOTE — Assessment & Plan Note (Addendum)
 Sees [facility]. Last appointment 10/07/23.  Taking Lyrica  100 mg TID for pain. Trying to avoid injection in back. Requested this provider increase dose of Lyrica  today. Discussed that I prefer that she discuss pain control with ortho and that she may need to get injection.  Provider not comfortable with increasing Lyrica  dose today.

## 2023-11-18 NOTE — Telephone Encounter (Signed)
 Pt scheduled for OV today. Denece Finger, CMA

## 2023-11-18 NOTE — Progress Notes (Signed)
 Established Patient Office Visit  Subjective   Patient ID: Robin Meza, female    DOB: 1966-01-24  Age: 58 y.o. MRN: [id number]  Chief Complaint  Patient presents with   Medical Management of Chronic Issues    Pt legs are swollen right side the worse    Right leg is swollen. Painful in the calf. Symptoms present since yesterday. No history of blood clot. No injury to right leg.  No shortness of breath.    Back pain: Taking Lyrica  100 mg TID.  Left leg pain radiating to back. No swelling or calf pain. Present for 2 weeks.  Has appointment with ortho [facility] next for back pain.  Will get ortho to discuss management of low back pain.  Last appointment 10/07/23. Trying to avoid back injection for pain.         Review of Systems  Respiratory:  Negative for shortness of breath.   Cardiovascular:  Negative for chest pain.  Musculoskeletal:        Right lower leg pain and swelling.       Objective:     BP (!) 143/71 (BP Location: Left Arm, Patient Position: Sitting, Cuff Size: Normal)   Pulse 85   Temp 97.9 F (36.6 C) (Oral)   Resp 18   Ht 5\' 3"  (1.6 m)   Wt 193 lb 3 oz (87.6 kg)   LMP 09/21/2010   SpO2 97%   BMI 34.22 kg/m  BP Readings from Last 3 Encounters:  11/18/23 (!) 143/71  11/01/23 99/64  09/14/23 114/70      Physical Exam Vitals and nursing note reviewed.  Constitutional:      General: She is not in acute distress.    Appearance: Normal appearance. She is obese.  Cardiovascular:     Rate and Rhythm: Regular rhythm.     Heart sounds: Normal heart sounds.  Pulmonary:     Effort: Pulmonary effort is normal.     Breath sounds: Normal breath sounds.  Musculoskeletal:     Left lower leg: Tenderness present. 2+ Edema present.     Comments: Right calf pain with significant swelling in leg and foot.   Skin:    General: Skin is warm and dry.  Neurological:     General: No focal deficit present.     Mental Status: She is alert. Mental  status is at baseline.  Psychiatric:        Mood and Affect: Mood normal.        Behavior: Behavior normal.        Thought Content: Thought content normal.        Judgment: Judgment normal.     No results found for any visits on 11/18/23.    The ASCVD Risk score (Arnett DK, et al., 2019) failed to calculate for the following reasons:   The valid total cholesterol range is 130 to 320 mg/dL    Assessment & Plan:   Problem List Items Addressed This Visit     Right calf pain - Primary   Significant right leg calf pain and swelling. She will go to ultrasound right away to rule out DVT. Ultrasound does not show DVT. Notified of results via phone call.  Recommend compression stockings, elevating legs. Blood pressure elevated today, hydrochlorothiazide 12.5 mg daily for next 10 days for lower leg edema. Follow-up if symptoms do not resolve.        Relevant Orders   US  Venous Img Lower Unilateral Right (DVT) (Completed)  Chronic left-sided low back pain   [facility]. Last appointment 10/07/23.  Taking Lyrica  100 mg TID for pain. Trying to avoid injection in back. Requested this provider increase dose of Lyrica  today. Discussed that I prefer that she discuss pain control with ortho and that she may need to get injection.  Provider not comfortable with increasing Lyrica  dose today.      Agrees with plan of care discussed.  Questions answered.   Return if symptoms worsen or fail to improve.    Mickiel Albany, FNP

## 2023-11-18 NOTE — Patient Instructions (Signed)
 [facility]  [address]  The radiology department is on the first floor which is best accessed by going around to the back of the building. No appointment necessary. You can go at your convenience.

## 2023-12-10 ENCOUNTER — Other Ambulatory Visit: Payer: Self-pay | Admitting: Family Medicine

## 2023-12-10 NOTE — Telephone Encounter (Signed)
Written by historical provider. 

## 2023-12-15 ENCOUNTER — Ambulatory Visit: Admitting: Family Medicine

## 2023-12-16 ENCOUNTER — Ambulatory Visit: Admitting: Family Medicine

## 2023-12-16 ENCOUNTER — Encounter: Payer: Self-pay | Admitting: Family Medicine

## 2023-12-16 ENCOUNTER — Ambulatory Visit (INDEPENDENT_AMBULATORY_CARE_PROVIDER_SITE_OTHER): Admitting: Family Medicine

## 2023-12-16 VITALS — BP 115/74 | HR 79 | Temp 97.9°F | Resp 18 | Ht 63.0 in | Wt 201.4 lb

## 2023-12-16 DIAGNOSIS — I10 Essential (primary) hypertension: Secondary | ICD-10-CM

## 2023-12-16 DIAGNOSIS — R7303 Prediabetes: Secondary | ICD-10-CM | POA: Diagnosis not present

## 2023-12-16 DIAGNOSIS — R600 Localized edema: Secondary | ICD-10-CM | POA: Diagnosis not present

## 2023-12-16 MED ORDER — LISINOPRIL 5 MG PO TABS: 5.0000 mg | ORAL_TABLET | Freq: Every day | ORAL | 0 refills | Status: DC

## 2023-12-16 MED ORDER — TIRZEPATIDE 10 MG/0.5ML ~~LOC~~ SOAJ: 10.0000 mg | SUBCUTANEOUS | 0 refills | Status: DC

## 2023-12-16 MED ORDER — EMPAGLIFLOZIN 10 MG PO TABS: 10.0000 mg | ORAL_TABLET | Freq: Every day | ORAL | 0 refills | Status: DC

## 2023-12-16 NOTE — Assessment & Plan Note (Addendum)
 Taking lisinopril  5 mg daily and lasix 20 mg daily. Imdur 15 mg daily  Denies chest pain, shortness of breath, vision changes, headaches.Endorses  lower extremity edema worse on right. This is not new.  Blood pressure is well controlled in office today.  Sees cardiology for heart failure. Recently told to increase Lasix for 3-5 days for increased swelling.  No shortness of breath. Recommend smoking cessation.  BMP today.  Follow-up in 3 months.

## 2023-12-16 NOTE — Assessment & Plan Note (Addendum)
 Taking Jardiance 10 mg and Mounjaro 7.5 mg weekly as prescribed.  Denies chest pain, shortness of breath, vision changes, polydipsia, polyphagia, polyuria. No side effects reported. Ready to increase dose today.  Denies hypoglycemia.  A1C today. Last A1C 5.7 Increase Mounjaro to 10 mg weekly. Follow-up in 3 months for medication management.

## 2023-12-16 NOTE — Progress Notes (Signed)
 Established Patient Office Visit  Subjective   Patient ID: Robin Meza, female    DOB: [birth year]  Age: 58 y.o. MRN: [id number]  Chief Complaint  Patient presents with   Medical Management of Chronic Issues    DM pt also mentioned she is retaining a lot of fluid that she noticed afrter    HPI   Hypertension Medication compliance: Taking lisinopril  5 mg daily and lasix 20 mg daily. Imdur 15 mg daily  Denies chest pain, shortness of breath, vision changes, headaches.Endorses  lower extremity edema worse on right. This is not new.  Pertinent lab work: 09/16/23: BMP GFR 59, K+ 4.7 Monitoring at home: 110/70's  Tolerating medication well: no side effects  Continue current medication regimen: no changes.  Follow-up: 3 months    Diabetes: Medication compliance: Taking Jardiance 10 mg and Mounjaro 7.5 mg weekly as prescribed.  Denies chest pain, shortness of breath, vision changes, polydipsia, polyphagia, polyuria. No side effects reported. Ready to increase dose today.  Denies hypoglycemia.  Pertinent lab work: A1C: 08/31/23 A1C 5.7 Monitoring: blood sugar readings at home: does not monitor at home          Continue current medication regimen: increase Mounjaro to 10 mg weekly   Well controlled: check A1c   Follow-up: 3 months   Chart review: 11/19/23: Cards to increase to 2 lasix tablets in the morning for next 3-5 days for lower extremity edema.  12/09/23: PET  MPRESSION:  1. Normal LV perfusion  2. Normal LV systolic function with appropriate augmentation with stress  3. Global myocardial flow reserve is preserved (>2)  BNP normal on 09/16/23      ROS    Objective:     BP 115/74 (BP Location: Left Arm, Patient Position: Sitting, Cuff Size: Normal)   Pulse 79   Temp 97.9 F (36.6 C) (Oral)   Resp 18   Ht 5' 3 (1.6 m)   Wt 201 lb 6 oz (91.3 kg)   LMP 09/21/2010   SpO2 98%   BMI 35.67 kg/m  BP Readings from Last 3 Encounters:  12/16/23 115/74  11/18/23  (!) 143/71  11/01/23 99/64      Physical Exam Vitals and nursing note reviewed.  Constitutional:      General: She is not in acute distress.    Appearance: Normal appearance. She is obese.   Cardiovascular:     Rate and Rhythm: Regular rhythm.     Heart sounds: Normal heart sounds.  Pulmonary:     Effort: Pulmonary effort is normal.     Breath sounds: Normal breath sounds.   Skin:    General: Skin is warm and dry.   Neurological:     General: No focal deficit present.     Mental Status: She is alert. Mental status is at baseline.   Psychiatric:        Mood and Affect: Mood normal.        Behavior: Behavior normal.        Thought Content: Thought content normal.        Judgment: Judgment normal.     No results found for any visits on 12/16/23.  Last metabolic panel Lab Results  Component Value Date   GLUCOSE 101 (H) 03/28/2011   NA 141 03/28/2011   K 4.2 03/28/2011   CL 107 03/28/2011   CO2 22 07/02/2009   BUN 16 03/28/2011   CREATININE 0.70 03/28/2011   CALCIUM 9.3 07/02/2009   PROT 7.3 07/02/2009  ALBUMIN 4.2 07/02/2009   BILITOT 1.1 07/02/2009   ALKPHOS 40 07/02/2009   AST 26 07/02/2009   ALT 15 07/02/2009   Last hemoglobin A1c Lab Results  Component Value Date   HGBA1C 5.7 (H) 08/31/2023      The ASCVD Risk score (Arnett DK, et al., 2019) failed to calculate for the following reasons:   The valid total cholesterol range is 130 to 320 mg/dL    Assessment & Plan:   Problem List Items Addressed This Visit     Prediabetes   Taking Jardiance 10 mg and Mounjaro 7.5 mg weekly as prescribed.  Denies chest pain, shortness of breath, vision changes, polydipsia, polyphagia, polyuria. No side effects reported. Ready to increase dose today.  Denies hypoglycemia.  A1C today. Last A1C 5.7 Increase Mounjaro to 10 mg weekly. Follow-up in 3 months for medication management.       Relevant Medications   tirzepatide (MOUNJARO) 10 MG/0.5ML Pen   Other  Relevant Orders   Hemoglobin A1c   Primary hypertension - Primary   Taking lisinopril  5 mg daily and lasix 20 mg daily. Imdur 15 mg daily  Denies chest pain, shortness of breath, vision changes, headaches.Endorses  lower extremity edema worse on right. This is not new.  Blood pressure is well controlled in office today.  Sees cardiology for heart failure. Recently told to increase Lasix for 3-5 days for increased swelling.  No shortness of breath. Recommend smoking cessation.  BMP today.  Follow-up in 3 months.        Relevant Medications   isosorbide mononitrate (IMDUR) 30 MG 24 hr tablet   lisinopril  (ZESTRIL ) 5 MG tablet   Other Relevant Orders   Basic metabolic panel with GFR   Lower extremity edema   Recommend compression stockings and elevating legs as much as possible. Normal LV function on recent evaluation by cards.  Recent BNP normal.  No shortness of breath.        Relevant Orders   Ambulatory Referral for DME   Basic metabolic panel with GFR  Agrees with plan of care discussed.  Questions answered.   Return in about 3 months (around 03/17/2024) for HTN, DM.    Mickiel Albany, FNP

## 2023-12-16 NOTE — Assessment & Plan Note (Addendum)
 Recommend compression stockings and elevating legs as much as possible. Normal LV function on recent evaluation by cards.  Recent BNP normal.  No shortness of breath.

## 2023-12-17 ENCOUNTER — Ambulatory Visit: Payer: Self-pay | Admitting: Family Medicine

## 2023-12-17 LAB — HEMOGLOBIN A1C
Est. average glucose Bld gHb Est-mCnc: 111 mg/dL
Hgb A1c MFr Bld: 5.5 % (ref 4.8–5.6)

## 2023-12-17 LAB — BASIC METABOLIC PANEL WITH GFR
BUN/Creatinine Ratio: 21 (ref 9–23)
BUN: 19 mg/dL (ref 6–24)
CO2: 22 mmol/L (ref 20–29)
Calcium: 9.3 mg/dL (ref 8.7–10.2)
Chloride: 109 mmol/L — ABNORMAL HIGH (ref 96–106)
Creatinine, Ser: 0.92 mg/dL (ref 0.57–1.00)
Glucose: 75 mg/dL (ref 70–99)
Potassium: 5 mmol/L (ref 3.5–5.2)
Sodium: 148 mmol/L — ABNORMAL HIGH (ref 134–144)
eGFR: 72 mL/min/{1.73_m2} (ref >59)

## 2024-01-05 ENCOUNTER — Encounter: Payer: Self-pay | Admitting: Family Medicine

## 2024-01-05 ENCOUNTER — Telehealth: Payer: Self-pay | Admitting: Family Medicine

## 2024-01-05 NOTE — Telephone Encounter (Signed)
 Patient is aware of cost and out of pocket status. Patient advised to try a local pharmacy , [employer], or medical supply store. She would like to know what size to order for her compression stockings. Please advise.

## 2024-01-05 NOTE — Telephone Encounter (Signed)
 Received the following message from [facility] regarding patients compression stockings;   Emmie English [email or web address]  [place] The pt's insurance is Out of Network and she would have to pay Out of Pocket for the stockings. Please let me know if the patient wants to proceed with the order. Thank you   [place] Baxter [facility]  [email or web address]  [place] The price for [phone] compression stockings out of pocket is $97.50 per pair.

## 2024-01-18 ENCOUNTER — Telehealth: Payer: Self-pay

## 2024-01-18 ENCOUNTER — Other Ambulatory Visit: Payer: Self-pay | Admitting: Family Medicine

## 2024-01-18 DIAGNOSIS — E66811 Obesity, class 1: Secondary | ICD-10-CM

## 2024-01-18 DIAGNOSIS — R7303 Prediabetes: Secondary | ICD-10-CM

## 2024-01-18 MED ORDER — TIRZEPATIDE 7.5 MG/0.5ML ~~LOC~~ SOAJ: 7.5000 mg | SUBCUTANEOUS | 0 refills | Status: DC

## 2024-01-18 NOTE — Telephone Encounter (Signed)
 [facility] located in [place] requesting med refill for Mounjaro  7.5 mg.

## 2024-01-18 NOTE — Telephone Encounter (Signed)
 Request received from [facility] that patient has requested to decrease Mounjaro  dose. Mounjaro  7.5 mg sent to pharmacy.

## 2024-01-31 ENCOUNTER — Encounter: Payer: Self-pay | Admitting: Family Medicine

## 2024-02-09 ENCOUNTER — Other Ambulatory Visit: Payer: Self-pay | Admitting: Family Medicine

## 2024-02-09 MED ORDER — BUPROPION HCL ER (SR) 150 MG PO TB12: 150.0000 mg | ORAL_TABLET | Freq: Two times a day (BID) | ORAL | 0 refills | Status: DC

## 2024-03-09 ENCOUNTER — Other Ambulatory Visit: Payer: Self-pay | Admitting: Family Medicine

## 2024-03-09 DIAGNOSIS — R7303 Prediabetes: Secondary | ICD-10-CM

## 2024-03-09 DIAGNOSIS — I10 Essential (primary) hypertension: Secondary | ICD-10-CM

## 2024-03-09 DIAGNOSIS — E661 Drug-induced obesity: Secondary | ICD-10-CM

## 2024-03-09 MED ORDER — TIRZEPATIDE 7.5 MG/0.5ML ~~LOC~~ SOAJ: 7.5000 mg | SUBCUTANEOUS | 0 refills | Status: DC

## 2024-03-09 MED ORDER — EMPAGLIFLOZIN 10 MG PO TABS: 10.0000 mg | ORAL_TABLET | Freq: Every day | ORAL | 0 refills | Status: DC

## 2024-03-09 MED ORDER — LISINOPRIL 5 MG PO TABS: 5.0000 mg | ORAL_TABLET | Freq: Every day | ORAL | 0 refills | Status: DC

## 2024-03-13 ENCOUNTER — Ambulatory Visit: Payer: Self-pay

## 2024-03-13 NOTE — Telephone Encounter (Signed)
 Patient hit her head on the night stand, I advised her to go to UC or the emergency room.

## 2024-03-13 NOTE — Telephone Encounter (Signed)
 FYI Only or Action Required?: Action required by provider: clinical question for provider.  Patient was last seen in primary care on 12/16/2023 by Booker Darice SAUNDERS, FNP.  Called Nurse Triage reporting Fall.  Symptoms began Saturday.  Interventions attempted: Nothing.  Symptoms are: gradually worsening.  Triage Disposition: See PCP When Office is Open (Within 3 Days)  Patient/caregiver understands and will follow disposition?: No, wishes to speak with PCP    Copied from CRM #[id number]. Topic: Clinical - Red Word Triage >> Mar 13, 2024  9:48 AM Donna BRAVO wrote: Red Word that prompted transfer to Nurse Triage: patient fell Saturday 03/10/24 hit head hurt like hell 3rd or 4 fall this year, patient legs give out, get this feeling especially left leg, patient has shooting pain down leg before falling. Left leg now on thigh bruise size of half dollar  bad pain in left leg  Patient wondering if this is a stroke Reason for Disposition  MILD weakness (e.g., does not interfere with ability to work, go to school, normal activities)  (Exception: Mild weakness is a chronic symptom.)  Answer Assessment - Initial Assessment Questions 1. MECHANISM: How did the fall happen?     Chronic pain on left side - pt thinks she fell out of bed 2. DOMESTIC VIOLENCE AND ELDER ABUSE SCREENING: Did you fall because someone pushed you or tried to hurt you? If Yes, ask: Are you safe now?     no 3. ONSET: When did the fall happen? (e.g., minutes, hours, or days ago)     Saturday 4. LOCATION: What part of the body hit the ground? (e.g., back, buttocks, head, hips, knees, hands, head, stomach)     Left side, head 5. INJURY: Did you hurt (injure) yourself when you fell? If Yes, ask: What did you injure? Tell me more about this? (e.g., body area; type of injury; pain severity)     Bruise on left side 6. PAIN: Is there any pain? If Yes, ask: How bad is the pain? (e.g., Scale 0-10; or none, mild,       5/10 left side: takes lyrica  TID 7. SIZE: For cuts, bruises, or swelling, ask: How large is it? (e.g., inches or centimeters)      na 8. PREGNANCY: Is there any chance you are pregnant? When was your last menstrual period?     na 9. OTHER SYMPTOMS: Do you have any other symptoms? (e.g., dizziness, fever, weakness; new-onset or worsening).      Pain, weakness 10. CAUSE: What do you think caused the fall (or falling)? (e.g., dizzy spell, tripped)       Chronic pain  3 or 4 falls this year. Pt suffers from chronic pain.  Attempted to schedule pt with PCP for fall appt w/n 3 days: no appts open with PCP & pt does not want to be seen by another PCP.  PCP pt has an appt on 03/17/2024: however NT recommended pt to be evaluated w/n  3 days - please call pt to inform is able to work in.  Protocols used: Falls and [facility]

## 2024-03-17 ENCOUNTER — Ambulatory Visit: Admitting: Family Medicine

## 2024-03-17 ENCOUNTER — Encounter: Payer: Self-pay | Admitting: Family Medicine

## 2024-03-17 VITALS — BP 129/78 | HR 83 | Temp 98.3°F | Ht 63.0 in | Wt 210.0 lb

## 2024-03-17 DIAGNOSIS — R7303 Prediabetes: Secondary | ICD-10-CM | POA: Diagnosis not present

## 2024-03-17 DIAGNOSIS — W19XXXD Unspecified fall, subsequent encounter: Secondary | ICD-10-CM

## 2024-03-17 DIAGNOSIS — W19XXXA Unspecified fall, initial encounter: Secondary | ICD-10-CM | POA: Insufficient documentation

## 2024-03-17 DIAGNOSIS — Z23 Encounter for immunization: Secondary | ICD-10-CM

## 2024-03-17 DIAGNOSIS — I10 Essential (primary) hypertension: Secondary | ICD-10-CM | POA: Diagnosis not present

## 2024-03-17 DIAGNOSIS — E7849 Other hyperlipidemia: Secondary | ICD-10-CM

## 2024-03-17 DIAGNOSIS — R600 Localized edema: Secondary | ICD-10-CM | POA: Diagnosis not present

## 2024-03-17 MED ORDER — TIRZEPATIDE 10 MG/0.5ML ~~LOC~~ SOAJ: 10.0000 mg | SUBCUTANEOUS | 0 refills | Status: DC

## 2024-03-17 NOTE — Progress Notes (Signed)
 Established Patient Office Visit  Subjective   Patient ID: Robin Meza, female    DOB: 1966/04/05  Age: 58 y.o. MRN: [id number]  Chief Complaint  Patient presents with   Follow-up    Follow-up for HTN and DM Also wants to let you know that she had eye surgery last Friday. She fell Monday but was not really injured, just a bruise on her left leg, and really bad pain on the left leg. She was given Flexeril and Tylenol  with Codeine     HPI  Hypertension Medication compliance: Taking lisinopril  5 mg and lasix 20 mg daily Denies chest pain, shortness of breath, lower extremity edema, vision changes, headaches.  Pertinent lab work: BMP on 12/16/23: check today.  Monitoring at home: monitors at home, does not have numbers today Tolerating medication well: no side effects reported. Continue current medication regimen: well controlled.  Follow-up: 3 months   Diabetes: Medication compliance: Taking Jardiance  10 mg daily, Mounjaro  10 mg weekly.  Tolerating Mounjaro  well, no side effects.  Denies chest pain, shortness of breath, vision changes, polydipsia, polyphagia, polyuria. Denies hypoglycemia.  Pertinent lab work: A1C: 12/16/23: A1C 5.5, recheck today.  Monitoring: blood sugar readings at home: 98-112 fasting           Continue current medication regimen: no changes  Well controlled: check A1C today   Follow-up: 3 months   Hyperlipidemia:  Medication compliance: Taking atorvastatin 80 mg daily. Denies chest pain, shortness of breath, lower extremity edema, myalgias, muscle weakness, changes in appearance of urine.  Pertinent lab work: lipid panel today Continue current medication regimen: no changes  Follow-up: 3 months     Fall:  Went to urgent care. Feeling better. X-rays negative. Using cane today. Was told to speak with ortho for chronic back problems.   Eye surgery: Cataracts removed.  Wearing sunglasses today.    ROS    Objective:     BP 129/78    Pulse 83   Temp 98.3 F (36.8 C) (Oral)   Ht 5' 3 (1.6 m)   Wt 210 lb (95.3 kg)   LMP 09/21/2010   SpO2 94%   BMI 37.20 kg/m    Physical Exam Vitals and nursing note reviewed.  Constitutional:      General: She is not in acute distress.    Appearance: Normal appearance.  Cardiovascular:     Heart sounds: Normal heart sounds.  Pulmonary:     Effort: Pulmonary effort is normal.     Breath sounds: Normal breath sounds.  Skin:    General: Skin is warm and dry.  Neurological:     General: No focal deficit present.     Mental Status: She is alert. Mental status is at baseline.  Psychiatric:        Mood and Affect: Mood normal.        Behavior: Behavior normal.        Thought Content: Thought content normal.        Judgment: Judgment normal.      No results found for any visits on 03/17/24.    The ASCVD Risk score (Arnett DK, et al., 2019) failed to calculate for the following reasons:   The valid total cholesterol range is 130 to 320 mg/dL    Assessment & Plan:   Problem List Items Addressed This Visit     Prediabetes   Taking Jardiance  10 mg daily, Mounjaro  10 mg weekly.  Tolerating Mounjaro  well, no side effects.  Denies chest pain, shortness  of breath, vision changes, polydipsia, polyphagia, polyuria. Denies hypoglycemia.  Pertinent lab work: A1C: 12/16/23: A1C 5.5, recheck today.  Monitoring: blood sugar readings at home: 98-112 fasting    Refill sent today for Mounjaro  10 mg weekly. BMP, A1C, urine microalbumin.  Follow-up in 3 months      Relevant Medications   tirzepatide  (MOUNJARO ) 10 MG/0.5ML Pen   Other Relevant Orders   Basic metabolic panel with GFR   Hemoglobin A1c   Microalbumin / creatinine urine ratio   Primary hypertension   Taking lisinopril  5 mg and lasix 20 mg daily Denies chest pain, shortness of breath, vision changes, headaches. On Lasix for lower extremity edema. Pertinent lab work: BMP on 12/16/23: check today.  No refills needed  today. BMP today. Follow-up in 3 months      Lower extremity edema - Primary   Has attempted to get compression stockings.  Recommend going to local DME store to see if they can be covered by insurance.        Other hyperlipidemia   Taking atorvastatin 80 mg daily. Denies chest pain, shortness of breath, lower extremity edema, myalgias, muscle weakness, changes in appearance of urine.  Pertinent lab work: lipid panel today Follow-up in 3 months.  No refills needed today.       Relevant Orders   Lipid panel   Encounter for administration of vaccine   Relevant Orders   Flu vaccine trivalent PF, 6mos and older(Flulaval,Afluria,Fluarix,Fluzone) (Completed)   Fall   Had a fall that affected her left side. Went to ED on 03/13/24.  X-rays were negative. Was told she needed to speak with her ortho specialist due to the condition of her back. She is not ready to  have surgery. Continue using cane for safety. Fall prevention information provided today.      Agrees with plan of care discussed.  Questions answered.   Return in about 3 months (around 06/16/2024) for DM, HTN, HLD .    Darice JONELLE Brownie, FNP

## 2024-03-17 NOTE — Assessment & Plan Note (Signed)
 Taking atorvastatin 80 mg daily. Denies chest pain, shortness of breath, lower extremity edema, myalgias, muscle weakness, changes in appearance of urine.  Pertinent lab work: lipid panel today Follow-up in 3 months.  No refills needed today.

## 2024-03-17 NOTE — Assessment & Plan Note (Addendum)
 Had a fall that affected her left side. Went to ED on 03/13/24.  X-rays were negative. Was told she needed to speak with her ortho specialist due to the condition of her back. She is not ready to  have surgery. Continue using cane for safety. Fall prevention information provided today.

## 2024-03-17 NOTE — Assessment & Plan Note (Signed)
 Has attempted to get compression stockings.  Recommend going to local DME store to see if they can be covered by insurance.

## 2024-03-17 NOTE — Assessment & Plan Note (Signed)
 Taking Jardiance  10 mg daily, Mounjaro  10 mg weekly.  Tolerating Mounjaro  well, no side effects.  Denies chest pain, shortness of breath, vision changes, polydipsia, polyphagia, polyuria. Denies hypoglycemia.  Pertinent lab work: A1C: 12/16/23: A1C 5.5, recheck today.  Monitoring: blood sugar readings at home: 98-112 fasting    Refill sent today for Mounjaro  10 mg weekly. BMP, A1C, urine microalbumin.  Follow-up in 3 months

## 2024-03-17 NOTE — Assessment & Plan Note (Signed)
 Taking lisinopril  5 mg and lasix 20 mg daily Denies chest pain, shortness of breath, vision changes, headaches. On Lasix for lower extremity edema. Pertinent lab work: BMP on 12/16/23: check today.  No refills needed today. BMP today. Follow-up in 3 months

## 2024-03-18 LAB — BASIC METABOLIC PANEL WITH GFR
BUN/Creatinine Ratio: 20 (ref 9–23)
BUN: 23 mg/dL (ref 6–24)
CO2: 23 mmol/L (ref 20–29)
Calcium: 9.5 mg/dL (ref 8.7–10.2)
Chloride: 107 mmol/L — ABNORMAL HIGH (ref 96–106)
Creatinine, Ser: 1.15 mg/dL — ABNORMAL HIGH (ref 0.57–1.00)
Glucose: 81 mg/dL (ref 70–99)
Potassium: 4.2 mmol/L (ref 3.5–5.2)
Sodium: 145 mmol/L — ABNORMAL HIGH (ref 134–144)
eGFR: 55 mL/min/1.73 — ABNORMAL LOW (ref >59)

## 2024-03-18 LAB — MICROALBUMIN / CREATININE URINE RATIO
Creatinine, Urine: 30.8 mg/dL
Microalb/Creat Ratio: <10 mg/g{creat} (ref 0–29)
Microalbumin, Urine: <3 ug/mL

## 2024-03-18 LAB — LIPID PANEL
Chol/HDL Ratio: 1.5 ratio (ref 0.0–4.4)
Cholesterol, Total: 94 mg/dL — ABNORMAL LOW (ref 100–199)
HDL: 64 mg/dL (ref >39)
LDL Chol Calc (NIH): 17 mg/dL (ref 0–99)
Triglycerides: 51 mg/dL (ref 0–149)
VLDL Cholesterol Cal: 13 mg/dL (ref 5–40)

## 2024-03-18 LAB — HEMOGLOBIN A1C
Est. average glucose Bld gHb Est-mCnc: 117 mg/dL
Hgb A1c MFr Bld: 5.7 % — ABNORMAL HIGH (ref 4.8–5.6)

## 2024-03-19 ENCOUNTER — Ambulatory Visit: Payer: Self-pay | Admitting: Family Medicine

## 2024-03-20 ENCOUNTER — Other Ambulatory Visit: Payer: Self-pay | Admitting: Family Medicine

## 2024-04-16 DIAGNOSIS — I2510 Atherosclerotic heart disease of native coronary artery without angina pectoris: Secondary | ICD-10-CM | POA: Diagnosis not present

## 2024-04-16 DIAGNOSIS — I110 Hypertensive heart disease with heart failure: Secondary | ICD-10-CM | POA: Diagnosis not present

## 2024-04-16 DIAGNOSIS — M79604 Pain in right leg: Secondary | ICD-10-CM | POA: Diagnosis not present

## 2024-04-16 DIAGNOSIS — Z79899 Other long term (current) drug therapy: Secondary | ICD-10-CM | POA: Diagnosis not present

## 2024-04-16 DIAGNOSIS — J449 Chronic obstructive pulmonary disease, unspecified: Secondary | ICD-10-CM | POA: Diagnosis not present

## 2024-04-16 DIAGNOSIS — J45909 Unspecified asthma, uncomplicated: Secondary | ICD-10-CM | POA: Diagnosis not present

## 2024-04-16 DIAGNOSIS — E785 Hyperlipidemia, unspecified: Secondary | ICD-10-CM | POA: Diagnosis not present

## 2024-04-16 DIAGNOSIS — M7989 Other specified soft tissue disorders: Secondary | ICD-10-CM | POA: Diagnosis not present

## 2024-04-16 DIAGNOSIS — M25552 Pain in left hip: Secondary | ICD-10-CM | POA: Diagnosis not present

## 2024-04-16 DIAGNOSIS — M545 Low back pain, unspecified: Secondary | ICD-10-CM | POA: Diagnosis not present

## 2024-04-16 DIAGNOSIS — F17210 Nicotine dependence, cigarettes, uncomplicated: Secondary | ICD-10-CM | POA: Diagnosis not present

## 2024-04-16 DIAGNOSIS — I5030 Unspecified diastolic (congestive) heart failure: Secondary | ICD-10-CM | POA: Diagnosis not present

## 2024-04-16 DIAGNOSIS — G8929 Other chronic pain: Secondary | ICD-10-CM | POA: Diagnosis not present

## 2024-04-16 DIAGNOSIS — M79605 Pain in left leg: Secondary | ICD-10-CM | POA: Diagnosis not present

## 2024-04-16 DIAGNOSIS — M1990 Unspecified osteoarthritis, unspecified site: Secondary | ICD-10-CM | POA: Diagnosis not present

## 2024-04-16 DIAGNOSIS — R262 Difficulty in walking, not elsewhere classified: Secondary | ICD-10-CM | POA: Diagnosis not present

## 2024-04-16 DIAGNOSIS — G43909 Migraine, unspecified, not intractable, without status migrainosus: Secondary | ICD-10-CM | POA: Diagnosis not present

## 2024-04-17 DIAGNOSIS — M25552 Pain in left hip: Secondary | ICD-10-CM | POA: Diagnosis not present

## 2024-04-19 DIAGNOSIS — M5412 Radiculopathy, cervical region: Secondary | ICD-10-CM | POA: Diagnosis not present

## 2024-04-19 DIAGNOSIS — M47816 Spondylosis without myelopathy or radiculopathy, lumbar region: Secondary | ICD-10-CM | POA: Diagnosis not present

## 2024-04-21 DIAGNOSIS — M4316 Spondylolisthesis, lumbar region: Secondary | ICD-10-CM | POA: Diagnosis not present

## 2024-04-21 DIAGNOSIS — M5412 Radiculopathy, cervical region: Secondary | ICD-10-CM | POA: Diagnosis not present

## 2024-04-21 DIAGNOSIS — M47816 Spondylosis without myelopathy or radiculopathy, lumbar region: Secondary | ICD-10-CM | POA: Diagnosis not present

## 2024-04-21 DIAGNOSIS — R296 Repeated falls: Secondary | ICD-10-CM | POA: Insufficient documentation

## 2024-04-25 DIAGNOSIS — I5032 Chronic diastolic (congestive) heart failure: Secondary | ICD-10-CM | POA: Diagnosis not present

## 2024-05-08 ENCOUNTER — Encounter: Payer: Self-pay | Admitting: Radiology

## 2024-06-07 ENCOUNTER — Other Ambulatory Visit: Payer: Self-pay | Admitting: Family Medicine

## 2024-06-07 DIAGNOSIS — R7303 Prediabetes: Secondary | ICD-10-CM

## 2024-06-07 MED ORDER — BUPROPION HCL ER (SR) 150 MG PO TB12: 150.0000 mg | ORAL_TABLET | Freq: Two times a day (BID) | ORAL | 3 refills | Status: AC

## 2024-06-07 MED ORDER — TIRZEPATIDE 10 MG/0.5ML ~~LOC~~ SOAJ: 10.0000 mg | SUBCUTANEOUS | 1 refills | Status: AC

## 2024-06-16 ENCOUNTER — Encounter: Payer: Self-pay | Admitting: Family Medicine

## 2024-06-16 ENCOUNTER — Ambulatory Visit: Admitting: Family Medicine

## 2024-06-16 VITALS — BP 128/80 | HR 87 | Temp 97.9°F | Ht 63.0 in | Wt 214.0 lb

## 2024-06-16 DIAGNOSIS — I10 Essential (primary) hypertension: Secondary | ICD-10-CM | POA: Diagnosis not present

## 2024-06-16 DIAGNOSIS — R296 Repeated falls: Secondary | ICD-10-CM

## 2024-06-16 DIAGNOSIS — R7303 Prediabetes: Secondary | ICD-10-CM

## 2024-06-16 DIAGNOSIS — F17200 Nicotine dependence, unspecified, uncomplicated: Secondary | ICD-10-CM | POA: Insufficient documentation

## 2024-06-16 DIAGNOSIS — F324 Major depressive disorder, single episode, in partial remission: Secondary | ICD-10-CM | POA: Diagnosis not present

## 2024-06-16 MED ORDER — LISINOPRIL 5 MG PO TABS: 5.0000 mg | ORAL_TABLET | Freq: Every day | ORAL | 3 refills | Status: AC

## 2024-06-16 MED ORDER — EMPAGLIFLOZIN 10 MG PO TABS: 10.0000 mg | ORAL_TABLET | Freq: Every day | ORAL | 3 refills | Status: AC

## 2024-06-16 NOTE — Assessment & Plan Note (Signed)
 Information provided on smoking cessation. Understands the importance of smoking cessation. It is hard Continue to encourage.

## 2024-06-16 NOTE — Assessment & Plan Note (Signed)
 Taking  lisinopril  5 mg daily. Blood pressure is well controlled. She has close follow-up with cardiology for CAD,  Heart Failure, HTN, and HLD: Last seen 04/25/24, next appointment: 06/26/24 with Rosina Ahle, PA  Refill sent for lisinopril  today. Cardiology handles other cardiac medications. Lasix has been increased to 40 mg per cards due to HF and increasing SOB.

## 2024-06-16 NOTE — Progress Notes (Signed)
 Established Patient Office Visit  Subjective   Patient ID: Robin Meza, female    DOB: 1966/06/06  Age: 58 y.o. MRN: [id number]  Chief Complaint  Patient presents with   Diabetes    3 month follow up   Hypertension   Hyperlipidemia    Depression: Wellbutrin  SR 150 mg Q 12 Symptoms are well controlled. PHQ-9: 9 No thoughts of self harm.  GAD-7: 8 Refills sent previously.  DM: Jardiance  10 mg daily, Mounjaro  10 mg weekly  03/17/24: A1C: 5.7 Recheck A1C today.  Refills sent. Follow-up in 3 months.   Falls: 3 times since last September. Uses cane.  Has seen PT in the past with some improvement. Will send another referral.  Referral placed to [facility] whom she has seen in the past.    Cardiology follows closely for CAD,  Heart Failure, HTN, and HLD: Last seen 04/25/24, next appointment: 06/26/24 with Rosina Ahle, PA  04/25/24: GFR: 80 03/17/24: LDL: 17 CAD: Indur 15 mg daily HF: Lasix increased on 04/25/24 to 40 mg by cards  Increased due to shortness of breath. HTN: lisinopril  5 mg daily- refills per PCP HLD:  atorvastatin 80 mg daily (cards)        ROS    Objective:     BP 128/80 (BP Location: Left Arm, Patient Position: Sitting, Cuff Size: Large)   Pulse 87   Temp 97.9 F (36.6 C) (Oral)   Ht 5' 3 (1.6 m)   Wt 214 lb (97.1 kg)   LMP 09/21/2010   SpO2 95%   BMI 37.91 kg/m    Physical Exam Vitals and nursing note reviewed.  Constitutional:      General: She is not in acute distress.    Appearance: Normal appearance.  Cardiovascular:     Rate and Rhythm: Regular rhythm.     Heart sounds: Normal heart sounds.  Pulmonary:     Effort: Pulmonary effort is normal.     Breath sounds: Normal breath sounds.  Skin:    General: Skin is warm and dry.  Neurological:     General: No focal deficit present.     Mental Status: She is alert. Mental status is at baseline.  Psychiatric:        Mood and Affect: Mood normal.        Behavior:  Behavior normal.        Thought Content: Thought content normal.        Judgment: Judgment normal.        06/16/2024   10:35 AM  Depression screen PHQ 2/9  Decreased Interest 1  Down, Depressed, Hopeless 1  PHQ - 2 Score 2  Altered sleeping 3  Tired, decreased energy 1  Change in appetite 1  Feeling bad or failure about yourself  1  Trouble concentrating 1  Moving slowly or fidgety/restless 0  Suicidal thoughts 0  PHQ-9 Score 9  Difficult doing work/chores Somewhat difficult      06/16/2024   10:49 AM 06/16/2024   10:36 AM  GAD 7 : Generalized Anxiety Score  Nervous, Anxious, on Edge 2 2  Control/stop worrying 2 2  Worry too much - different things 1   Trouble relaxing 1 1  Restless 1 1  Easily annoyed or irritable 0 0  Afraid - awful might happen 1 1  Total GAD 7 Score 8   Anxiety Difficulty Somewhat difficult Somewhat difficult     No results found for any visits on 06/16/24.    The  ASCVD Risk score (Arnett DK, et al., 2019) failed to calculate for the following reasons:   The valid total cholesterol range is 130 to 320 mg/dL    Assessment & Plan:   Prediabetes Assessment & Plan: Jardiance  10 mg daily, Mounjaro  10 mg weekly  Tolerates well. No side effects reported.  03/17/24: A1C: 5.7 04/25/24: GFR 80 Recheck A1C today.  Refills sent. Follow-up in 3 months.   Orders: -     Hemoglobin A1c -     Empagliflozin ; Take 1 tablet (10 mg total) by mouth daily.  Dispense: 90 tablet; Refill: 3  Depression, major, single episode, in partial remission Assessment & Plan: Wellbutrin  SR 150 mg Q 12 Symptoms are well controlled. PHQ-9: 9 No thoughts of self harm.  GAD-7: 8 Refills sent previously.  Follow-up in 3 months.    Primary hypertension Assessment & Plan: Taking  lisinopril  5 mg daily. Blood pressure is well controlled. She has close follow-up with cardiology for CAD,  Heart Failure, HTN, and HLD: Last seen 04/25/24, next appointment: 06/26/24  with Rosina Ahle, PA  Refill sent for lisinopril  today. Cardiology handles other cardiac medications. Lasix has been increased to 40 mg per cards due to HF and increasing SOB.   Orders: -     Lisinopril ; Take 1 tablet (5 mg total) by mouth daily.  Dispense: 90 tablet; Refill: 3  Multiple falls Assessment & Plan: Urgent referral placed for PT.  Continue using cane.   Orders: -     Ambulatory referral to Physical Therapy  Needs smoking cessation education Assessment & Plan: Information provided on smoking cessation. Understands the importance of smoking cessation. It is hard Continue to encourage.         Return in about 3 months (around 09/14/2024) for chronic conditions .    Darice JONELLE Brownie, FNP

## 2024-06-16 NOTE — Assessment & Plan Note (Signed)
 Urgent referral placed for PT.  Continue using cane.

## 2024-06-16 NOTE — Assessment & Plan Note (Signed)
 Jardiance  10 mg daily, Mounjaro  10 mg weekly  Tolerates well. No side effects reported.  03/17/24: A1C: 5.7 04/25/24: GFR 80 Recheck A1C today.  Refills sent. Follow-up in 3 months.

## 2024-06-16 NOTE — Assessment & Plan Note (Signed)
 Wellbutrin  SR 150 mg Q 12 Symptoms are well controlled. PHQ-9: 9 No thoughts of self harm.  GAD-7: 8 Refills sent previously.  Follow-up in 3 months.

## 2024-06-17 ENCOUNTER — Ambulatory Visit: Payer: Self-pay | Admitting: Family Medicine

## 2024-06-17 LAB — HEMOGLOBIN A1C
Est. average glucose Bld gHb Est-mCnc: 117 mg/dL
Hgb A1c MFr Bld: 5.7 % — ABNORMAL HIGH (ref 4.8–5.6)

## 2024-06-26 ENCOUNTER — Encounter: Payer: Self-pay | Admitting: Family Medicine

## 2024-06-26 ENCOUNTER — Ambulatory Visit: Admitting: Family Medicine

## 2024-06-26 VITALS — BP 115/74 | HR 87 | Temp 97.9°F | Ht 63.0 in | Wt 210.8 lb

## 2024-06-26 DIAGNOSIS — Z0000 Encounter for general adult medical examination without abnormal findings: Secondary | ICD-10-CM

## 2024-06-26 NOTE — Progress Notes (Signed)
 "  Chief Complaint  Patient presents with   [name] VISIT     Subjective:   Robin Meza is a 58 y.o. female who presents for a Medicare Annual Wellness Visit.  Visit info / Clinical Intake: Persons participating in visit and providing information:: patient Medicare Wellness Visit Mode:: In-person (required for WTM) Interpreter Needed?: No Pre-visit prep was completed: no AWV questionnaire completed by patient prior to visit?: no Living arrangements:: (!) lives alone Patient's Overall Health Status Rating: (!) fair Typical amount of pain: some Does pain affect daily life?: (!) yes (occassionally) Are you currently prescribed opioids?: no  Dietary Habits and Nutritional Risks How many meals a day?: 2 Eats fruit and vegetables daily?: yes Most meals are obtained by: preparing own meals In the last 2 weeks, have you had any of the following?: none Diabetic:: (!) yes Any non-healing wounds?: no How often do you check your BS?: as needed Would you like to be referred to a Nutritionist or for Diabetic Management? : no  Functional Status Activities of Daily Living (to include ambulation/medication): Independent Ambulation: Independent Medication Administration: Independent Home Management (perform basic housework or laundry): Needs assistance (comment) (due to back pain.) Manage your own finances?: yes Primary transportation is: driving Concerns about vision?: no *vision screening is required for WTM* Concerns about hearing?: no  Fall Screening Falls in the past year?: 1 Number of falls in past year: 1 Was there an injury with Fall?: 1 Fall Risk Category Calculator: 3 Patient Fall Risk Level: High Fall Risk  Fall Risk Patient at Risk for Falls Due to: History of fall(s); Orthopedic patient Fall risk Follow up: Education provided (referred to PT)  Home and Transportation Safety: All rugs have non-skid backing?: N/A, no rugs All stairs or steps have railings?:  N/A, no stairs Grab bars in the bathtub or shower?: (!) no (shower chair) Have non-skid surface in bathtub or shower?: yes Good home lighting?: yes Regular seat belt use?: yes Hospital stays in the last year:: (!) yes How many hospital stays:: 1 Reason: fell, stayed 2 nights  Cognitive Assessment Difficulty concentrating, remembering, or making decisions? : no Will 6CIT or Mini Cog be Completed: yes What year is it?: 0 points What month is it?: 0 points Give patient an address phrase to remember (5 components): [address] [place] About what time is it?: 0 points Count backwards from 20 to 1: 0 points Say the months of the year in reverse: 2 points Repeat the address phrase from earlier: 2 points 6 CIT Score: 4 points  Advance Directives (For Healthcare) Does Patient Have a Medical Advance Directive?: Yes Does patient want to make changes to medical advance directive?: No - Patient declined Type of Advance Directive: Healthcare Power of [place]; Living will Copy of Healthcare Power of Attorney in Chart?: No - copy requested Copy of Living Will in Chart?: No - copy requested Living Will Requested and Now in Chart: -- (requested)  Reviewed/Updated  Reviewed/Updated: Reviewed All (Medical, Surgical, Family, Medications, Allergies, Care Teams, Patient Goals)    Allergies (verified) Aspirin, Sulfa antibiotics, Latex, Bee pollen, Pollen extract, Pork allergy, Sulfamethoxazole-trimethoprim, and Tomato   Current Medications (verified) Outpatient Encounter Medications as of 06/26/2024  Medication Sig   albuterol (VENTOLIN HFA) 108 (90 Base) MCG/ACT inhaler Inhale 2 puffs by mouth every 6 hours as needed for wheezing.   ascorbic acid (VITAMIN C) 500 MG tablet Take 2 tablets by mouth every day   aspirin EC 81 MG tablet  Take 1 tablet by mouth daily.   atorvastatin (LIPITOR) 80 MG tablet Take 80 mg by mouth daily.   Biotin 1000 MCG tablet Take 1,000 mcg by mouth daily.    buPROPion  (WELLBUTRIN  SR) 150 MG 12 hr tablet Take 1 tablet (150 mg total) by mouth 2 (two) times daily.   cholecalciferol (VITAMIN D3) 25 MCG (1000 UNIT) tablet Take 1 tablet by mouth every day   Docusate Calcium (STOOL SOFTENER PO) Take 1 tablet by mouth daily.   empagliflozin  (JARDIANCE ) 10 MG TABS tablet Take 1 tablet (10 mg total) by mouth daily.   Flaxseed, Linseed, (FLAXSEED OIL PO) Take by mouth.   furosemide (LASIX) 40 MG tablet Take 40 mg by mouth daily.   hydrOXYzine (ATARAX/VISTARIL) 10 MG tablet Take 10 mg by mouth 3 (three) times daily as needed for anxiety.   isosorbide mononitrate (IMDUR) 30 MG 24 hr tablet Take 15 mg by mouth daily.   lisinopril  (ZESTRIL ) 5 MG tablet Take 1 tablet (5 mg total) by mouth daily.   pregabalin  (LYRICA ) 100 MG capsule Take 1 capsule (100 mg total) by mouth 3 (three) times daily. Patient states that she takes 4 x's daily (Patient taking differently: Take 150 mg by mouth 3 (three) times daily. Patient states that she takes 4 x's daily)   tirzepatide  (MOUNJARO ) 10 MG/0.5ML Pen Inject 10 mg into the skin once a week.   TRELEGY ELLIPTA 200-62.5-25 MCG/ACT AEPB Inhale 1 puff into the lungs daily.   No facility-administered encounter medications on file as of 06/26/2024.    History: Past Medical History:  Diagnosis Date   Arthritis    Asthma    Diabetes mellitus without complication (HCC)    Gout    Hypertension    Past Surgical History:  Procedure Laterality Date   CESAREAN SECTION     History reviewed. No pertinent family history. Social History   Occupational History   Not on file  Tobacco Use   Smoking status: Some Days    Current packs/day: 1.00    Types: Cigarettes   Smokeless tobacco: Current  Substance and Sexual Activity   Alcohol use: No   Drug use: Never   Sexual activity: Not Currently   Tobacco Counseling Ready to quit: Not Answered Counseling given: Not Answered  SDOH Screenings   Food Insecurity: No Food Insecurity  (06/26/2024)  Housing: Low Risk (06/26/2024)  Transportation Needs: No Transportation Needs (06/26/2024)  Utilities: Not At Risk (06/26/2024)  Depression (PHQ2-9): Medium Risk (06/26/2024)  Physical Activity: Insufficiently Active (06/26/2024)  Social Connections: Moderately Integrated (06/26/2024)  Stress: No Stress Concern Present (06/26/2024)  Tobacco Use: High Risk (06/26/2024)  Health Literacy: Adequate Health Literacy (06/26/2024)   See flowsheets for full screening details  Depression Screen PHQ 2 & 9 Depression Scale- Over the past 2 weeks, how often have you been bothered by any of the following problems? Little interest or pleasure in doing things: 1 Feeling down, depressed, or hopeless (PHQ Adolescent also includes...irritable): 0 PHQ-2 Total Score: 1 Trouble falling or staying asleep, or sleeping too much: 2 Feeling tired or having little energy: 1 Poor appetite or overeating (PHQ Adolescent also includes...weight loss): 1 Feeling bad about yourself - or that you are a failure or have let yourself or your family down: 0 Trouble concentrating on things, such as reading the newspaper or watching television (PHQ Adolescent also includes...like school work): 1 Moving or speaking so slowly that other people could have noticed. Or the opposite - being so fidgety or  restless that you have been moving around a lot more than usual: 0 Thoughts that you would be better off dead, or of hurting yourself in some way: 0 PHQ-9 Total Score: 6 If you checked off any problems, how difficult have these problems made it for you to do your work, take care of things at home, or get along with other people?: Somewhat difficult     Goals Addressed             This Visit's Progress    lose more weight       Weight loss goal: 15-20 pounds Plans to start back with the [employer]. Goal weight: 175 pounds.              Objective:    Today's Vitals   06/26/24 1450  BP: 115/74  Pulse: 87   Temp: 97.9 F (36.6 C)  TempSrc: Oral  SpO2: 93%  Weight: 210 lb 12.8 oz (95.6 kg)  Height: 5' 3 (1.6 m)   Body mass index is 37.34 kg/m.  Hearing/Vision screen Hearing Screening - Comments:: Grossly intact.  Vision Screening - Comments:: Wearing glasses, no concerns  Immunizations and Health Maintenance Health Maintenance  Topic Date Due   HIV Screening  Never done   Hepatitis C Screening  Never done   Hepatitis B Vaccines 19-59 Average Risk (1 of 3 - 19+ 3-dose series) Never done   Zoster Vaccines- Shingrix (1 of 2) Never done   Medicare Annual Wellness (AWV)  06/26/2025   Mammogram  07/26/2025   Cervical Cancer Screening (HPV/Pap Cotest)  06/27/2026   DTaP/Tdap/Td (2 - Td or Tdap) 10/15/2027   Colonoscopy  08/12/2031   Pneumococcal Vaccine: 50+ Years  Completed   Influenza Vaccine  Completed   COVID-19 Vaccine  Completed   HPV VACCINES  Aged Out   Meningococcal B Vaccine  Aged Out        Assessment/Plan:  This is a routine wellness examination for Latiesha.  Patient Care Team: Booker Darice SAUNDERS, FNP as PCP - General (Family Medicine)  I have personally reviewed and noted the following in the patients chart:   Medical and social history Use of alcohol, tobacco or illicit drugs  Current medications and supplements including opioid prescriptions. Functional ability and status Nutritional status Physical activity: plans to to back to [facility], PT referral placed at last visit.  Advanced directives: requested to bring  copy for chart.  List of other physicians: Dr. Estelle, Cardiology, G. Tanner, PA, orthopedics Hospitalizations, surgeries, and ER visits in previous 12 months: fell and stayed 2 nights in hospital.  Vitals Screenings to include cognitive, depression, and falls Referrals and appointments: none placed today.   No orders of the defined types were placed in this encounter.  In addition, I have reviewed and discussed with patient certain preventive  protocols, quality metrics, and best practice recommendations. A written personalized care plan for preventive services as well as general preventive health recommendations were provided to patient.   Darice SAUNDERS Booker, FNP   06/26/2024   No follow-ups on file.  After Visit Summary: (In Person-Printed) AVS printed and given to the patient  Nurse Notes: Up to date on vaccines. Needs copy of Shingrix vaccine sent to the office.  "

## 2024-06-26 NOTE — Patient Instructions (Signed)
 Robin Meza,  Thank you for taking the time for your Medicare Wellness Visit. I appreciate your continued commitment to your health goals. Please review the care plan we discussed, and feel free to reach out if I can assist you further.  Please note that Annual Wellness Visits do not include a physical exam. Some assessments may be limited, especially if the visit was conducted virtually. If needed, we may recommend an in-person follow-up with your provider.  Ongoing Care Seeing your primary care provider every 3 to 6 months helps us  monitor your health and provide consistent, personalized care.   Referrals If a referral was made during today's visit and you haven't received any updates within two weeks, please contact the referred provider directly to check on the status.  Recommended Screenings:  Health Maintenance  Topic Date Due   HIV Screening  Never done   Hepatitis C Screening  Never done   Hepatitis B Vaccine (1 of 3 - 19+ 3-dose series) Never done   Colon Cancer Screening  Never done   Zoster (Shingles) Vaccine (1 of 2) Never done   COVID-19 Vaccine (5 - 2025-26 season) 03/06/2024   Medicare Annual Wellness Visit  06/27/2024   Breast Cancer Screening  07/26/2025   Pap with HPV screening  06/27/2026   DTaP/Tdap/Td vaccine (2 - Td or Tdap) 10/15/2027   Pneumococcal Vaccine for age over 68  Completed   Flu Shot  Completed   HPV Vaccine  Aged Out   Meningitis B Vaccine  Aged Out       06/26/2024    3:07 PM  Advanced Directives  Does Patient Have a Medical Advance Directive? Yes  Type of Estate Agent of [place];Living will  Does patient want to make changes to medical advance directive? No - Patient declined  Copy of Healthcare Power of Attorney in Chart? No - copy requested    Vision: Annual vision screenings are recommended for early detection of glaucoma, cataracts, and diabetic retinopathy. These exams can also reveal signs of chronic  conditions such as diabetes and high blood pressure.  Dental: Annual dental screenings help detect early signs of oral cancer, gum disease, and other conditions linked to overall health, including heart disease and diabetes.  Please see the attached documents for additional preventive care recommendations.

## 2024-07-28 LAB — HM MAMMOGRAPHY

## 2024-08-09 ENCOUNTER — Telehealth: Payer: Self-pay

## 2024-08-09 NOTE — Telephone Encounter (Signed)
 Copied from CRM [id number]. Topic: Clinical - Medication Refill >> Aug 09, 2024 10:22 AM Montie POUR wrote: Medication:   albuterol - solution for her nebulizer machine  Has the patient contacted their pharmacy? Yes (Agent: If no, request that the patient contact the pharmacy for the refill. If patient does not wish to contact the pharmacy document the reason why and proceed with request.) (Agent: If yes, when and what did the pharmacy advise?) Pharmacy needs order   This is the patient's preferred pharmacy:  [facility] [id number] - [place], [place] - [address] [place] [address] Phone: [phone] Fax: [phone]   Is this the correct pharmacy for this prescription? Yes If no, delete pharmacy and type the correct one.   Has the prescription been filled recently? No  Is the patient out of the medication? No - She will run out in 2 days  Has the patient been seen for an appointment in the last year OR does the patient have an upcoming appointment? Yes  Can we respond through MyChart? No  Agent: Please be advised that Rx refills may take up to 3 business days. We ask that you follow-up with your pharmacy. >> Aug 09, 2024 10:26 AM Montie POUR wrote: I could not find this on her med list. Please call her when this is completed at [phone]

## 2024-08-09 NOTE — Telephone Encounter (Signed)
 Attempted to contact the patient. Call unsuccessful. Unable to leave a msg. Voicemail full.

## 2024-08-09 NOTE — Telephone Encounter (Signed)
 The albuterol nebulizer solution is not listed in the active med list. Hx of COPD. Please advise, thanks.

## 2024-08-09 NOTE — Telephone Encounter (Signed)
 There is no neb on her medication list; even after reconciling outside medicines. Will need office visit before prescribing new medicine

## 2024-08-10 NOTE — Telephone Encounter (Signed)
 Task completed. The patient has been scheduled for 08/23/24 at 10 am with Dr. Colette to discuss and fill out FMLA paperwork. Per the patient, she will bring the box of the neb solution she needs a refill of.

## 2024-08-11 ENCOUNTER — Ambulatory Visit: Payer: Self-pay

## 2024-08-11 NOTE — Telephone Encounter (Signed)
 FYI Only or Action Required?: FYI only for provider: pt agreeable to ED d/t no in office availability.  Patient was last seen in primary care on 06/26/2024 by Booker Darice SAUNDERS, FNP.  Called Nurse Triage reporting Breathing Problem.  Symptoms began several weeks ago.  Interventions attempted: Prescription medications: albuterol, trelegy.  Symptoms are: gradually worsening.  Triage Disposition: See HCP Within 4 Hours (Or PCP Triage)  Patient/caregiver understands and will follow disposition?:   Reason for Disposition  [1] MILD difficulty breathing (e.g., minimal/no SOB at rest, SOB with walking, pulse < 100) AND [2] still present when not coughing  Answer Assessment - Initial Assessment Questions COPD, CHF pt reports productive cough with yellow sputum and occasional specks of blood x 2 weeks. Using albuterol inhaler and neb PRN and Trelegy daily. Takes lasix for CHF. Speaking clearly with pauses to catch breath. Reports last used nebulizer at 0800.   Pt at [facility] currently   1. ONSET: When did the cough begin?      2 weeks 2. SEVERITY: How bad is the cough today?      Keeps awake at night 3. SPUTUM: Describe the color of your sputum (e.g., none, dry cough; clear, white, yellow, green)     Yellow 4. HEMOPTYSIS: Are you coughing up any blood? If Yes, ask: How much? (e.g., flecks, streaks, tablespoons, etc.)     Specks 5. DIFFICULTY BREATHING: Are you having difficulty breathing? If Yes, ask: How bad is it? (e.g., mild, moderate, severe)      SOB w cough or exertion, not a rest 6. FEVER: Do you have a fever? If Yes, ask: What is your temperature, how was it measured, and when did it start?     Denies 7. CARDIAC HISTORY: Do you have any history of heart disease? (e.g., heart attack, congestive heart failure)      HTN, CHF 8. LUNG HISTORY: Do you have any history of lung disease?  (e.g., pulmonary embolus, asthma, emphysema)     Asthma/COPD 10. OTHER SYMPTOMS: Do  you have any other symptoms? (e.g., runny nose, wheezing, chest pain)       Occasional wheezing  Protocols used: Cough - Acute Productive-A-AH   Message from [place] D sent at 08/11/2024  1:06 PM EST  Pt is having difficulty breathing

## 2024-08-23 ENCOUNTER — Encounter: Admitting: Family Medicine
